# Patient Record
Sex: Female | Born: 1965 | Race: Black or African American | Hispanic: No | Marital: Married | Smoking: Never smoker
Health system: Southern US, Community
[De-identification: ages and names within clinical notes are randomized; demographics above are authoritative.]

## PROBLEM LIST (undated history)

## (undated) HISTORY — PX: BREAST MASS EXCISION: SHX1267

## (undated) HISTORY — PX: BREAST CYST EXCISION: SHX579

---

## 2011-05-25 DIAGNOSIS — D563 Thalassemia minor: Secondary | ICD-10-CM | POA: Insufficient documentation

## 2011-05-25 DIAGNOSIS — K219 Gastro-esophageal reflux disease without esophagitis: Secondary | ICD-10-CM | POA: Insufficient documentation

## 2013-02-15 DIAGNOSIS — M25569 Pain in unspecified knee: Secondary | ICD-10-CM | POA: Insufficient documentation

## 2013-03-18 DIAGNOSIS — M6282 Rhabdomyolysis: Secondary | ICD-10-CM | POA: Insufficient documentation

## 2013-08-14 HISTORY — PX: COLONOSCOPY W/ BIOPSIES: SHX1374

## 2013-10-22 ENCOUNTER — Encounter: Payer: Self-pay | Admitting: *Deleted

## 2013-10-22 NOTE — Telephone Encounter (Signed)
error 

## 2019-07-18 ENCOUNTER — Other Ambulatory Visit (HOSPITAL_COMMUNITY)
Admission: RE | Admit: 2019-07-18 | Discharge: 2019-07-18 | Disposition: A | Discharge status: Home / Self Care | Source: Ambulatory Visit | Attending: Family Medicine | Admitting: Family Medicine

## 2019-07-18 ENCOUNTER — Other Ambulatory Visit: Payer: Self-pay

## 2019-07-18 ENCOUNTER — Encounter: Payer: Self-pay | Admitting: Family Medicine

## 2019-07-18 ENCOUNTER — Ambulatory Visit (INDEPENDENT_AMBULATORY_CARE_PROVIDER_SITE_OTHER): Admitting: Family Medicine

## 2019-07-18 VITALS — BP 138/72 | HR 66 | Wt 141.0 lb

## 2019-07-18 DIAGNOSIS — Z114 Encounter for screening for human immunodeficiency virus [HIV]: Secondary | ICD-10-CM

## 2019-07-18 DIAGNOSIS — R030 Elevated blood-pressure reading, without diagnosis of hypertension: Secondary | ICD-10-CM

## 2019-07-18 DIAGNOSIS — Z124 Encounter for screening for malignant neoplasm of cervix: Secondary | ICD-10-CM

## 2019-07-18 DIAGNOSIS — G8929 Other chronic pain: Secondary | ICD-10-CM

## 2019-07-18 DIAGNOSIS — Z1231 Encounter for screening mammogram for malignant neoplasm of breast: Secondary | ICD-10-CM | POA: Diagnosis not present

## 2019-07-18 DIAGNOSIS — Z23 Encounter for immunization: Secondary | ICD-10-CM

## 2019-07-18 DIAGNOSIS — E789 Disorder of lipoprotein metabolism, unspecified: Secondary | ICD-10-CM

## 2019-07-18 DIAGNOSIS — Z8639 Personal history of other endocrine, nutritional and metabolic disease: Secondary | ICD-10-CM

## 2019-07-18 DIAGNOSIS — D563 Thalassemia minor: Secondary | ICD-10-CM

## 2019-07-18 DIAGNOSIS — M25561 Pain in right knee: Secondary | ICD-10-CM

## 2019-07-18 DIAGNOSIS — K219 Gastro-esophageal reflux disease without esophagitis: Secondary | ICD-10-CM

## 2019-07-18 LAB — POCT GLYCOSYLATED HEMOGLOBIN (HGB A1C): Hemoglobin A1C: 5.5 % (ref 4.0–5.6)

## 2019-07-18 NOTE — Patient Instructions (Addendum)
Dear Jasmine Rogers,   It was good to meet you! Thank you for taking your time to come in to be seen. Today, we discussed the following:   Annual Check up   We are checking several labs today, performing pap smear, and other health maintenance items such as ordering mammogram, flu and Tdap vaccines.   I will call you if there are any abnormalities for follow up.    Your blood pressure was elevated today and I would like to keep a close eye on that.  Please continue to take your blood pressure, even at work, and if they continue to be above 120/80, please write these measurements down and bring them into the office for follow-up appointment.  Orders Placed This Encounter  Procedures  . MM Digital Screening  . Tdap vaccine greater than or equal to 7yo IM  . Flu Vaccine QUAD 6+ mos PF IM (Fluarix Quad PF)  . Lipid Panel  . CBC with Differential  . TSH  . Comprehensive metabolic panel  . HIV antibody (with reflex)  . HgB A1c   Be well,   Zettie Cooley, M.D   [place] [phone]  *Sign up for MyChart for instant access to your health profile, labs, orders, upcoming appointments or to contact your provider with questions*  ===================================================================================  Hypertension, Adult High blood pressure (hypertension) is when the force of blood pumping through the arteries is too strong. The arteries are the blood vessels that carry blood from the heart throughout the body. Hypertension forces the heart to work harder to pump blood and may cause arteries to become narrow or stiff. Untreated or uncontrolled hypertension can cause a heart attack, heart failure, a stroke, kidney disease, and other problems. A blood pressure reading consists of a higher number over a lower number. Ideally, your blood pressure should be below 120/80. The first ("top") number is called the systolic pressure. It is a measure of the pressure in your  arteries as your heart beats. The second ("bottom") number is called the diastolic pressure. It is a measure of the pressure in your arteries as the heart relaxes. What are the causes? The exact cause of this condition is not known. There are some conditions that result in or are related to high blood pressure. What increases the risk? Some risk factors for high blood pressure are under your control. The following factors may make you more likely to develop this condition:  Smoking.  Having type 2 diabetes mellitus, high cholesterol, or both.  Not getting enough exercise or physical activity.  Being overweight.  Having too much fat, sugar, calories, or salt (sodium) in your diet.  Drinking too much alcohol. Some risk factors for high blood pressure may be difficult or impossible to change. Some of these factors include:  Having chronic kidney disease.  Having a family history of high blood pressure.  Age. Risk increases with age.  Race. You may be at higher risk if you are African American.  Gender. Men are at higher risk than women before age 22. After age 43, women are at higher risk than men.  Having obstructive sleep apnea.  Stress. What are the signs or symptoms? High blood pressure may not cause symptoms. Very high blood pressure (hypertensive crisis) may cause:  Headache.  Anxiety.  Shortness of breath.  Nosebleed.  Nausea and vomiting.  Vision changes.  Severe chest pain.  Seizures. How is this diagnosed? This condition is diagnosed by measuring your blood pressure  while you are seated, with your arm resting on a flat surface, your legs uncrossed, and your feet flat on the floor. The cuff of the blood pressure monitor will be placed directly against the skin of your upper arm at the level of your heart. It should be measured at least twice using the same arm. Certain conditions can cause a difference in blood pressure between your right and left  arms. Certain factors can cause blood pressure readings to be lower or higher than normal for a short period of time:  When your blood pressure is higher when you are in a health care provider's office than when you are at home, this is called white coat hypertension. Most people with this condition do not need medicines.  When your blood pressure is higher at home than when you are in a health care provider's office, this is called masked hypertension. Most people with this condition may need medicines to control blood pressure. If you have a high blood pressure reading during one visit or you have normal blood pressure with other risk factors, you may be asked to:  Return on a different day to have your blood pressure checked again.  Monitor your blood pressure at home for 1 week or longer. If you are diagnosed with hypertension, you may have other blood or imaging tests to help your health care provider understand your overall risk for other conditions. How is this treated? This condition is treated by making healthy lifestyle changes, such as eating healthy foods, exercising more, and reducing your alcohol intake. Your health care provider may prescribe medicine if lifestyle changes are not enough to get your blood pressure under control, and if:  Your systolic blood pressure is above 130.  Your diastolic blood pressure is above 80. Your personal target blood pressure may vary depending on your medical conditions, your age, and other factors. Follow these instructions at home: Eating and drinking   Eat a diet that is high in fiber and potassium, and low in sodium, added sugar, and fat. An example eating plan is called the DASH (Dietary Approaches to Stop Hypertension) diet. To eat this way: ? Eat plenty of fresh fruits and vegetables. Try to fill one half of your plate at each meal with fruits and vegetables. ? Eat whole grains, such as whole-wheat pasta, [place] rice, or whole-grain  bread. Fill about one fourth of your plate with whole grains. ? Eat or drink low-fat dairy products, such as skim milk or low-fat yogurt. ? Avoid fatty cuts of meat, processed or cured meats, and poultry with skin. Fill about one fourth of your plate with lean proteins, such as fish, chicken without skin, beans, eggs, or tofu. ? Avoid pre-made and processed foods. These tend to be higher in sodium, added sugar, and fat.  Reduce your daily sodium intake. Most people with hypertension should eat less than 1,500 mg of sodium a day.  Do not drink alcohol if: ? Your health care provider tells you not to drink. ? You are pregnant, may be pregnant, or are planning to become pregnant.  If you drink alcohol: ? Limit how much you use to:  0-1 drink a day for women.  0-2 drinks a day for men. ? Be aware of how much alcohol is in your drink. In the [place] one drink equals one 12 oz bottle of beer (355 mL), one 5 oz glass of wine (148 mL), or one 1 oz glass of hard liquor (44 mL).  Lifestyle   Work with your health care provider to maintain a healthy body weight or to lose weight. Ask what an ideal weight is for you.  Get at least 30 minutes of exercise most days of the week. Activities may include walking, swimming, or biking.  Include exercise to strengthen your muscles (resistance exercise), such as Pilates or lifting weights, as part of your weekly exercise routine. Try to do these types of exercises for 30 minutes at least 3 days a week.  Do not use any products that contain nicotine or tobacco, such as cigarettes, e-cigarettes, and chewing tobacco. If you need help quitting, ask your health care provider.  Monitor your blood pressure at home as told by your health care provider.  Keep all follow-up visits as told by your health care provider. This is important. Medicines  Take over-the-counter and prescription medicines only as told by your health care provider. Follow directions carefully.  Blood pressure medicines must be taken as prescribed.  Do not skip doses of blood pressure medicine. Doing this puts you at risk for problems and can make the medicine less effective.  Ask your health care provider about side effects or reactions to medicines that you should watch for. Contact a health care provider if you:  Think you are having a reaction to a medicine you are taking.  Have headaches that keep coming back (recurring).  Feel dizzy.  Have swelling in your ankles.  Have trouble with your vision. Get help right away if you:  Develop a severe headache or confusion.  Have unusual weakness or numbness.  Feel faint.  Have severe pain in your chest or abdomen.  Vomit repeatedly.  Have trouble breathing. Summary  Hypertension is when the force of blood pumping through your arteries is too strong. If this condition is not controlled, it may put you at risk for serious complications.  Your personal target blood pressure may vary depending on your medical conditions, your age, and other factors. For most people, a normal blood pressure is less than 120/80.  Hypertension is treated with lifestyle changes, medicines, or a combination of both. Lifestyle changes include losing weight, eating a healthy, low-sodium diet, exercising more, and limiting alcohol. This information is not intended to replace advice given to you by your health care provider. Make sure you discuss any questions you have with your health care provider. Document Released: 10/11/2005 Document Revised: 06/21/2018 Document Reviewed: 06/21/2018 Elsevier Patient Education  2020 Elsevier Inc.  Thalassemia  Thalassemia is a blood disorder that causes a low level of red blood cells (anemia). This condition is passed from parent to child through abnormal genes (gene mutations). The mutations make it hard for your body to make the protein in red blood cells (hemoglobin) that carries oxygen from your lungs to  the rest of your body. Red blood cells do not live long without hemoglobin. Loss of red blood cells leads to anemia, which is the main symptom of thalassemia. There are two main types of thalassemia. The type depends on which part of the hemoglobin is affected.  Alpha thalassemia affects the alpha part of the hemoglobin. This is caused by four genes. You could get two from each parent.  Beta thalassemia affects the beta part of the hemoglobin. This is caused by two genes. You could get one from each parent. Thalassemia can be mild or severe. It depends on how many gene mutations you are born with. The more gene mutations you get, the more severe  the condition. A person who inherits just one gene will be a carrier of the condition (thalassemia trait). A person with thalassemia trait may not have any symptoms or may have only mild anemia. A person who inherits two or more genes can have thalassemia minor, thalassemia intermedia, or thalassemia major. Thalassemia is a lifelong condition. There is no cure, but treatment can control symptoms and manage the condition. What are the causes? Thalassemia is cause by gene mutations that are passed down through families. What increases the risk? You are more likely to develop this condition if:  You have a family history of thalassemia.  Your ancestors are from [place], [place], [place], [place], [place], or the [place] What are the signs or symptoms? The most common signs and symptoms of thalassemia are the signs and symptoms of anemia. They include:  Weakness.  Tiredness.  Pounding heartbeat.  Dizziness.  Headache.  Leg cramps.  Pale skin.  Confusion.  Shortness of breath. Other signs and symptoms can also occur. You may have:  Yellow eyes or skin, and dark urine (jaundice). The breakdown of red blood cells can cause a yellowing pigment (bilirubin) to build up in your blood.  Weak bones (osteoporosis) and bone fractures. This is  because bones can weaken from the effort of making more hemoglobin.  An enlarged spleen. This can lead to a swollen belly. Your spleen can become enlarged from filtering dead red blood cells.  Frequent, severe infections. This occurs if your spleen and bone marrow become weak. These organs make white blood cells that your body needs to fight infections. How is this diagnosed? Your health care provider may suspect thalassemia based on your signs and symptoms, especially if you have a family history of the condition. This condition may be diagnosed:  In childhood, if you have severe forms of thalassemia. This is because symptoms show early in life.  At birth. In the [place] babies are screened for this condition.  In adulthood, if you have thalassemia trait or thalassemia minor. This happens if symptoms of anemia start or if a routine blood test shows unexplained anemia. Blood tests can confirm a thalassemia diagnosis. Blood tests may show:  Low hemoglobin.  Low iron.  Abnormal hemoglobin.  Thalassemia gene mutations. You may need to see a health care provider who specializes in blood diseases (hematologist). How is this treated? Treatment for this condition depends on the type of thalassemia that you have:  If you have thalassemia trait or thalassemia minor, you may not need treatment. However, you may need treatment if you have thalassemia minor and you develop symptoms during an infection.  If you have thalassemia intermedia, you will have symptoms that require treatment.  If you have major thalassemia, you will have serious symptoms that require regular treatment. Thalassemia treatment may include:  Donated blood (transfusions) to replace red blood cells.  Vitamin B (folic acid) supplements to help produce hemoglobin and red blood cells.  Medicines or injections to remove iron buildup (chelation). This can happen in people who have frequent transfusions. Iron overload can damage  heart, liver, and brain cells.  In the case of severe thalassemia: ? The spleen may need to be removed if it becomes damaged. ? Stem cell or bone marrow transplants may be necessary to transplant cells that can make red blood cells. This may be done if transfusions are not working. Follow these instructions at home: Eating and drinking   Follow instructions from your health care provider about eating or drinking  restrictions. You may need to avoid foods or drinks that are high in iron or fortified with iron.  Eat foods that are high in fiber, such as fresh fruits and vegetables, whole grains, and beans. Limit foods that are high in fat and processed sugars, such as fried and sweet foods. Nutrition is important for preventing anemia. Activity  Return to your normal activities as told by your health care provider. Ask your health care provider what activities are safe for you.  Exercise is important for maintaining energy and strong bones. Ask your health care provider what amount and type of exercise is safe for you. General instructions   Take over-the-counter and prescription medicines only as told by your health care provider.  Keep all routine vaccinations and flu shots up to date to reduce your risk of infection.  Wash your hands frequently.  Do your best to avoid sick people, and stay out of crowds during cold and flu seasons.  Meet with a Dentistgenetic counselor if you are or may become pregnant. A genetic counselor can explain the risks of passing thalassemia to a child.  Keep all follow-up visits as told by your health care provider. This is important. Contact a health care provider if:  You have signs or symptoms of anemia.  You have a fever or other signs of infection.  Your belly is swollen.  You have jaundice. Get help right away if:  You feel very weak or short of breath. Summary  Thalassemia is a blood disorder that causes anemia.  Thalassemia can range from  mild to severe.  This condition is passed down through families.  There is no cure, but treatment can manage the symptoms and prevent anemia. This information is not intended to replace advice given to you by your health care provider. Make sure you discuss any questions you have with your health care provider. Document Released: 02/07/2018 Document Revised: 02/07/2018 Document Reviewed: 02/07/2018 Elsevier Patient Education  2020 [employer]

## 2019-07-18 NOTE — Progress Notes (Signed)
New Patient Clinic Visit Subjective  Subjective:  Patient ID: [id number]  Date of birth: 05/17/1966  PCP: Melene Plan, MD  CC: Establish care    HPI Jasmine Rogers is a 53 y.o. female who presents today to establish care. She has no other complaints today. Patient reports that her last physical was on 12/19/2017 in [place].   Past medical history is significant for GERD, betal thalassemia trait, leukopenia, chronic knee pain, hyperglycemia.   HISTORY Reviewed with patient and updated in EMR as appropriate.  Allergies and Medications Allergies: latex and Strawberries  Medications: daily MVI   Past Medical History  GERD - previously took ranitidine, is not on anymore. Beta Thalassemia trait - No complications Leukocytopenia for several years  - no complications.  Chronic Knee pain -- Flares up with overuse. Previous X ray with little fluid, no OA changes,then PT. If she works 2-3 days straight for 12 hours, tends to flare up.  Elevated A1C & Cholesterol - not currently on a statin  Heart Mumur: Received echocardiogram in 05/03/2013 and treademill stress test on 03/29/2013 and returned normal  Previous Physician: Maryruth Eve, Robbie Lis, MD [facility] primary [facility]   Health Maintenance: Mammograms: last in 12/19/2017 - see care everywhere - annual with "digital screen" Colonoscopy: 07/28/2016 - repeat in 5 years  Pap Smear: patient prefers annual pap smears, though we discussed the new guidelines. Hx of LEEP in her 73's. No issues since then. Hep A, B, C screening negative  Menstrual History: Post-menopausal  OB History: [id number]  Family History family history includes Alcohol abuse in her brother; Alzheimer's disease in her mother; Hyperlipidemia in her father.   Social History  Makynzee reports that she has never smoked. She has never used smokeless tobacco. She reports current alcohol use of about 7.0 standard drinks of alcohol per week. She reports that she does not use drugs.  Lived in [place] for 15-16 years with husband who is now retired and she moved back to [place] where she was born and raised.  3 kids, 34 & 32 & 32 all girls.  Husband is age 51, no pets at home.  Education: finished some college  Review of Systems  Constitutional: Negative.   HENT: Negative.   Eyes: Negative.   Respiratory: Negative.   Cardiovascular: Negative.   Gastrointestinal: Negative.   Genitourinary: Negative.   Musculoskeletal: Negative.   Skin: Negative.   Neurological: Negative.   Endo/Heme/Allergies: Negative.   Psychiatric/Behavioral: Negative.      Objective  Objective:  Physical Exam:  BP 138/72   Pulse 66   Wt 141 lb (64 kg)   SpO2 100%   Gen: NAD, alert, non-toxic, 5'9" African American female, thin, fit.  HEENT: Normocephaic, atraumatic. PERRLA, clear conjuctiva, no scleral icterus and injection. Normal EOM. Hearing intact. TM pearly grey bilaterally with no fluid. Neck supple with no LAD, nodules, or gross abnormality. Nares patent with no discharge.  Maxillary and frontal sinuses nontender to palpation. Oropharynx without erythema and lesions.  Tonsils nonswollen and without exudate.   CV: Regular rate and rhythm.  Normal S1-S2. 1/6 systolic ejection murmur. No gallops, S3, S4 appreciated.  Normal capillary refill bilaterally.  Radial pulses 2+ bilaterally. No bilateral lower extremity edema. Resp: Clear to auscultation bilaterally.  No wheezing, rales, rhonchi, or other abnormal lung sounds.  No increased work of breathing appreciated. Abd: Nontender and nondistended on palpation to all 4 quadrants.  Positive bowel sounds. Skin: No obvious rashes, lesions, or trauma.  Normal turgor.  MSK: Normal ROM. Normal strength and tone.  Neuro: Cranial nerves II through VI grossly intact. Gait normal.  Alert and oriented x4.  No obvious abnormal movements. Psych: Cooperative with exam.  Normal speech. Pleasant. Makes good eye contact. GU: External vulva and vagina  nonerythematous, without any obvious lesions or rash.  Normal ruggae of vaginal walls.  Cervix is non erythematous and non-friable.  There is no cervical motion tenderness, masses or gross abnormalities appreciated during bimanual exam.    Pertinent Labs & Imaging:  Reviewed on Care Everywhere   12/19/2017 - RPR, GC/C,Syph negative  11/07/2017 - WBC 3.82 03/27/2013 - Vit D 49    Assessment  Assessment & Plan:  Knee pain No pain or abnormal phys exam findings  History of hyperglycemia Will obtain A1C and BMP  Esophageal reflux Not currently taking anything. Asymptomatic today  Abnormal serum cholesterol Has tried to manage cholesterol with diet and exercise. Has never been on statin.    Elevated blood pressure reading 138/72 today. Patient encouraged to continue taking BP at work when she is calm as she does not have a BP cuff at home. If values > 140/90, she should return for closer follow up   Health Maintenance: Mammogram and pap smear obtained. Flu and Tdap administered.   Follow up: No future appointments. - pending lab results   Wilber Oliphant, M.D.  PGY-2  Family Medicine  [phone] 07/19/2019 2:24 PM

## 2019-07-19 ENCOUNTER — Encounter: Payer: Self-pay | Admitting: Family Medicine

## 2019-07-19 DIAGNOSIS — E789 Disorder of lipoprotein metabolism, unspecified: Secondary | ICD-10-CM | POA: Insufficient documentation

## 2019-07-19 DIAGNOSIS — Z114 Encounter for screening for human immunodeficiency virus [HIV]: Secondary | ICD-10-CM | POA: Insufficient documentation

## 2019-07-19 DIAGNOSIS — R030 Elevated blood-pressure reading, without diagnosis of hypertension: Secondary | ICD-10-CM | POA: Insufficient documentation

## 2019-07-19 DIAGNOSIS — Z124 Encounter for screening for malignant neoplasm of cervix: Secondary | ICD-10-CM | POA: Insufficient documentation

## 2019-07-19 DIAGNOSIS — Z1231 Encounter for screening mammogram for malignant neoplasm of breast: Secondary | ICD-10-CM | POA: Insufficient documentation

## 2019-07-19 DIAGNOSIS — Z8639 Personal history of other endocrine, nutritional and metabolic disease: Secondary | ICD-10-CM | POA: Insufficient documentation

## 2019-07-19 LAB — COMPREHENSIVE METABOLIC PANEL
ALT: 20 IU/L (ref 0–32)
AST: 21 IU/L (ref 0–40)
Albumin/Globulin Ratio: 2.1 (ref 1.2–2.2)
Albumin: 4.8 g/dL (ref 3.8–4.9)
Alkaline Phosphatase: 62 IU/L (ref 39–117)
BUN/Creatinine Ratio: 17 (ref 9–23)
BUN: 12 mg/dL (ref 6–24)
Bilirubin Total: 0.4 mg/dL (ref 0.0–1.2)
CO2: 26 mmol/L (ref 20–29)
Calcium: 9.8 mg/dL (ref 8.7–10.2)
Chloride: 103 mmol/L (ref 96–106)
Creatinine, Ser: 0.71 mg/dL (ref 0.57–1.00)
GFR calc Af Amer: 112 mL/min/{1.73_m2} (ref >59)
GFR calc non Af Amer: 98 mL/min/{1.73_m2} (ref >59)
Globulin, Total: 2.3 g/dL (ref 1.5–4.5)
Glucose: 88 mg/dL (ref 65–99)
Potassium: 3.9 mmol/L (ref 3.5–5.2)
Sodium: 142 mmol/L (ref 134–144)
Total Protein: 7.1 g/dL (ref 6.0–8.5)

## 2019-07-19 LAB — CBC WITH DIFFERENTIAL/PLATELET
Basophils Absolute: 0 10*3/uL (ref 0.0–0.2)
Basos: 1 %
EOS (ABSOLUTE): 0 10*3/uL (ref 0.0–0.4)
Eos: 1 %
Hematocrit: 37.8 % (ref 34.0–46.6)
Hemoglobin: 12.3 g/dL (ref 11.1–15.9)
Immature Grans (Abs): 0 10*3/uL (ref 0.0–0.1)
Immature Granulocytes: 0 %
Lymphocytes Absolute: 1.3 10*3/uL (ref 0.7–3.1)
Lymphs: 38 %
MCH: 24.6 pg — ABNORMAL LOW (ref 26.6–33.0)
MCHC: 32.5 g/dL (ref 31.5–35.7)
MCV: 75 fL — ABNORMAL LOW (ref 79–97)
Monocytes Absolute: 0.3 10*3/uL (ref 0.1–0.9)
Monocytes: 9 %
Neutrophils Absolute: 1.8 10*3/uL (ref 1.4–7.0)
Neutrophils: 51 %
Platelets: 208 10*3/uL (ref 150–450)
RBC: 5.01 x10E6/uL (ref 3.77–5.28)
RDW: 14.3 % (ref 11.7–15.4)
WBC: 3.5 10*3/uL (ref 3.4–10.8)

## 2019-07-19 LAB — LIPID PANEL
Chol/HDL Ratio: 2.1 ratio (ref 0.0–4.4)
Cholesterol, Total: 230 mg/dL — ABNORMAL HIGH (ref 100–199)
HDL: 109 mg/dL (ref >39)
LDL Chol Calc (NIH): 113 mg/dL — ABNORMAL HIGH (ref 0–99)
Triglycerides: 44 mg/dL (ref 0–149)
VLDL Cholesterol Cal: 8 mg/dL (ref 5–40)

## 2019-07-19 LAB — HIV ANTIBODY (ROUTINE TESTING W REFLEX): HIV Screen 4th Generation wRfx: NONREACTIVE

## 2019-07-19 LAB — TSH: TSH: 0.948 u[IU]/mL (ref 0.450–4.500)

## 2019-07-19 NOTE — Assessment & Plan Note (Addendum)
Has tried to manage cholesterol with diet and exercise. Has never been on statin.

## 2019-07-19 NOTE — Assessment & Plan Note (Signed)
No pain or abnormal phys exam findings

## 2019-07-19 NOTE — Assessment & Plan Note (Signed)
138/72 today. Patient encouraged to continue taking BP at work when she is calm as she does not have a BP cuff at home. If values > 140/90, she should return for closer follow up

## 2019-07-19 NOTE — Assessment & Plan Note (Signed)
Not currently taking anything. Asymptomatic today

## 2019-07-19 NOTE — Assessment & Plan Note (Signed)
Will obtain A1C and BMP

## 2019-07-20 LAB — CYTOLOGY - PAP
Chlamydia: NEGATIVE
Diagnosis: NEGATIVE
High risk HPV: NEGATIVE
Molecular Disclaimer: 56
Molecular Disclaimer: NEGATIVE
Molecular Disclaimer: NORMAL
Molecular Disclaimer: NORMAL
Neisseria Gonorrhea: NEGATIVE

## 2019-07-23 ENCOUNTER — Encounter: Payer: Self-pay | Admitting: Family Medicine

## 2019-07-31 ENCOUNTER — Telehealth: Payer: Self-pay | Admitting: *Deleted

## 2019-07-31 NOTE — Telephone Encounter (Signed)
Pt calls back about results.  She states that she has not been exercising and has been eating a lot of fried food.  For now she wants to try diet and exercise and will have cholesterol rechecked in one year.  Christen Bame, CMA

## 2019-08-02 NOTE — Telephone Encounter (Signed)
Please ask patient to return in 3-6 months for a lab visit for lipid panel. One year would be a long time to wait to recheck after lifestyle changes.

## 2019-08-08 NOTE — Telephone Encounter (Signed)
Pt informed of below.Jasmine Rogers, CMA ? ?

## 2019-09-12 ENCOUNTER — Ambulatory Visit
Admission: RE | Admit: 2019-09-12 | Discharge: 2019-09-12 | Disposition: A | Discharge status: Home / Self Care | Source: Ambulatory Visit | Attending: Family Medicine | Admitting: Family Medicine

## 2019-09-12 ENCOUNTER — Other Ambulatory Visit: Payer: Self-pay

## 2019-09-12 DIAGNOSIS — Z1231 Encounter for screening mammogram for malignant neoplasm of breast: Secondary | ICD-10-CM

## 2019-10-31 ENCOUNTER — Other Ambulatory Visit: Payer: Self-pay

## 2019-10-31 ENCOUNTER — Telehealth (INDEPENDENT_AMBULATORY_CARE_PROVIDER_SITE_OTHER): Admitting: Family Medicine

## 2019-10-31 DIAGNOSIS — F419 Anxiety disorder, unspecified: Secondary | ICD-10-CM | POA: Diagnosis not present

## 2019-10-31 DIAGNOSIS — F329 Major depressive disorder, single episode, unspecified: Secondary | ICD-10-CM | POA: Diagnosis not present

## 2019-10-31 MED ORDER — FLUOXETINE HCL 20 MG PO TABS: 20.0000 mg | ORAL_TABLET | Freq: Every day | ORAL | 0 refills | Status: DC

## 2019-10-31 NOTE — Assessment & Plan Note (Signed)
Patient with approximately 40-month history of worsening depression.  Patient is also had issues with anxiety attacks which involve chest discomfort.  She has been evaluated for these and reports a negative cardiac work-up.  Patient is also had issues with sleep in this timeframe.  Patient is attempting good sleep hygiene although she does occasionally have a glass of wine or beer to help her get to sleep.  Patient has history of depression which she was treated for with Prozac approximately 30 years ago but reports good outcome. -Prescribed 20 mg Prozac daily -Counseled on the use of SSRIs and possible side effects -Follow-up in 1 month to see if depression is decreasing -Counseled on proper sleep hygiene -Recommended starting melatonin nightly 30 minutes prior to going to sleep

## 2019-10-31 NOTE — Patient Instructions (Addendum)
Melatonin  30 minutes before bedtime   Fluoxetine capsules or tablets (Depression/Mood Disorders) What is this medicine? FLUOXETINE (floo OX e teen) belongs to a class of drugs known as selective serotonin reuptake inhibitors (SSRIs). It helps to treat mood problems such as depression, obsessive compulsive disorder, and panic attacks. It can also treat certain eating disorders. This medicine may be used for other purposes; ask your health care provider or pharmacist if you have questions. COMMON BRAND NAME(S): Prozac What should I tell my health care provider before I take this medicine? They need to know if you have any of these conditions:  bipolar disorder or a family history of bipolar disorder  bleeding disorders  glaucoma  heart disease  liver disease  low levels of sodium in the blood  seizures  suicidal thoughts, plans, or attempt; a previous suicide attempt by you or a family member  take MAOIs like Carbex, Eldepryl, Marplan, Nardil, and Parnate  take medicines that treat or prevent blood clots  thyroid disease  an unusual or allergic reaction to fluoxetine, other medicines, foods, dyes, or preservatives  pregnant or trying to get pregnant  breast-feeding How should I use this medicine? Take this medicine by mouth with a glass of water. Follow the directions on the prescription label. You can take this medicine with or without food. Take your medicine at regular intervals. Do not take it more often than directed. Do not stop taking this medicine suddenly except upon the advice of your doctor. Stopping this medicine too quickly may cause serious side effects or your condition may worsen. A special MedGuide will be given to you by the pharmacist with each prescription and refill. Be sure to read this information carefully each time. Talk to your pediatrician regarding the use of this medicine in children. While this drug may be prescribed for children as young as 7  years for selected conditions, precautions do apply. Overdosage: If you think you have taken too much of this medicine contact a poison control center or emergency room at once. NOTE: This medicine is only for you. Do not share this medicine with others. What if I miss a dose? If you miss a dose, skip the missed dose and go back to your regular dosing schedule. Do not take double or extra doses. What may interact with this medicine? Do not take this medicine with any of the following medications:  other medicines containing fluoxetine, like Sarafem or Symbyax  cisapride  dronedarone  linezolid  MAOIs like Carbex, Eldepryl, Marplan, Nardil, and Parnate  methylene blue (injected into a vein)  pimozide  thioridazine This medicine may also interact with the following medications:  alcohol  amphetamines  aspirin and aspirin-like medicines  carbamazepine  certain medicines for depression, anxiety, or psychotic disturbances  certain medicines for migraine headaches like almotriptan, eletriptan, frovatriptan, naratriptan, rizatriptan, sumatriptan, zolmitriptan  digoxin  diuretics  fentanyl  flecainide  furazolidone  isoniazid  lithium  medicines for sleep  medicines that treat or prevent blood clots like warfarin, enoxaparin, and dalteparin  NSAIDs, medicines for pain and inflammation, like ibuprofen or naproxen  other medicines that prolong the QT interval (an abnormal heart rhythm)  phenytoin  procarbazine  propafenone  rasagiline  ritonavir  supplements like St. Demaria Deeney's wort, kava kava, valerian  tramadol  tryptophan  vinblastine This list may not describe all possible interactions. Give your health care provider a list of all the medicines, herbs, non-prescription drugs, or dietary supplements you use. Also tell them if  you smoke, drink alcohol, or use illegal drugs. Some items may interact with your medicine. What should I watch for while using  this medicine? Tell your doctor if your symptoms do not get better or if they get worse. Visit your doctor or health care professional for regular checks on your progress. Because it may take several weeks to see the full effects of this medicine, it is important to continue your treatment as prescribed by your doctor. Patients and their families should watch out for new or worsening thoughts of suicide or depression. Also watch out for sudden changes in feelings such as feeling anxious, agitated, panicky, irritable, hostile, aggressive, impulsive, severely restless, overly excited and hyperactive, or not being able to sleep. If this happens, especially at the beginning of treatment or after a change in dose, call your health care professional. Dennis Bast may get drowsy or dizzy. Do not drive, use machinery, or do anything that needs mental alertness until you know how this medicine affects you. Do not stand or sit up quickly, especially if you are an older patient. This reduces the risk of dizzy or fainting spells. Alcohol may interfere with the effect of this medicine. Avoid alcoholic drinks. Your mouth may get dry. Chewing sugarless gum or sucking hard candy, and drinking plenty of water may help. Contact your doctor if the problem does not go away or is severe. This medicine may affect blood sugar levels. If you have diabetes, check with your doctor or health care professional before you change your diet or the dose of your diabetic medicine. What side effects may I notice from receiving this medicine? Side effects that you should report to your doctor or health care professional as soon as possible:  allergic reactions like skin rash, itching or hives, swelling of the face, lips, or tongue  anxious  black, tarry stools  breathing problems  changes in vision  confusion  elevated mood, decreased need for sleep, racing thoughts, impulsive behavior  eye pain  fast, irregular heartbeat  feeling  faint or lightheaded, falls  feeling agitated, angry, or irritable  hallucination, loss of contact with reality  loss of balance or coordination  loss of memory  painful or prolonged erections  restlessness, pacing, inability to keep still  seizures  stiff muscles  suicidal thoughts or other mood changes  trouble sleeping  unusual bleeding or bruising  unusually weak or tired  vomiting Side effects that usually do not require medical attention (report to your doctor or health care professional if they continue or are bothersome):  change in appetite or weight  change in sex drive or performance  diarrhea  dry mouth  headache  increased sweating  nausea  tremors This list may not describe all possible side effects. Call your doctor for medical advice about side effects. You may report side effects to FDA at 1-800-FDA-1088. Where should I keep my medicine? Keep out of the reach of children. Store at room temperature between 15 and 30 degrees C (59 and 86 degrees F). Throw away any unused medicine after the expiration date. NOTE: This sheet is a summary. It may not cover all possible information. If you have questions about this medicine, talk to your doctor, pharmacist, or health care provider.  2020 Elsevier/Gold Standard (2018-06-01 11:56:53)

## 2019-10-31 NOTE — Progress Notes (Signed)
[facility] Telemedicine Visit  Patient consented to have virtual visit. Method of visit: Video  Encounter participants: Patient: Jasmine Rogers - located at home  Provider: Derrel Nip - located at home   Chief Complaint: Depression issues and anxiety   HPI:   Depression Patient reports that she and her husband moved from [place] to begin the year to be closer to the rest of her family.  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  Since March she has noticed worsening issues with depression.  She feels like because of Covid she cannot do the things she used to enjoy.  She still likes to walk around her neighborhood as well as ride bikes.  She does this regularly.  She has also noticed issues with sleep.  She sleeps 2 to 3 hours a night.  She will sometimes have 1 glass of wine or a beer to help her get to sleep but then she wakes up around 1 or 2 in the morning and is unable to get back to sleep.  She avoids caffeine before bed.  She has tried no other methods for increased sleep.  Patient also reports increased anxiety and that she has been having issues with what she thinks her anxiety attacks over the past few months.  She reports chest tightness which she feels like his chest anxiety in her chest.  She was seen in the [facility] emergency room in November for 1 of these episodes and reports that she had a cardiac work-up including EKG and lab work which came back normal.  She describes the feeling as discomfort, no pain, no shortness of breath or radiation anywhere.  She had an episode of this last night when she was feeling anxious but it is since resolved.  Patient reports issues with depression in the past when she went through a major life change.  This was approximately 30 years ago and she was given Prozac which she reports she had a good outcome with.  After a long discussion regarding her options patient wishes to try another trial of  Prozac.  ROS: per HPI  Pertinent PMHx: Depression, reflux  Exam: General: Patient appears well in no acute distress at the beginning of the exam.  She does have intermittent episodes of tearfulness throughout the discussion Respiratory: Speaking in clear sentences, no shortness of breath noted Psych: Patient denies any SI or HI  Assessment/Plan:  Anxiety and depression Patient with approximately 38-month history of worsening depression.  Patient is also had issues with anxiety attacks which involve chest discomfort.  She has been evaluated for these and reports a negative cardiac work-up.  Patient is also had issues with sleep in this timeframe.  Patient is attempting good sleep hygiene although she does occasionally have a glass of wine or beer to help her get to sleep.  Patient has history of depression which she was treated for with Prozac approximately 30 years ago but reports good outcome. -Prescribed 20 mg Prozac daily -Counseled on the use of SSRIs and possible side effects -Follow-up in 1 month to see if depression is decreasing -Counseled on proper sleep hygiene -Recommended starting melatonin nightly 30 minutes prior to going to sleep

## 2019-12-11 ENCOUNTER — Encounter (HOSPITAL_COMMUNITY): Payer: Self-pay | Admitting: Emergency Medicine

## 2019-12-11 ENCOUNTER — Emergency Department (HOSPITAL_COMMUNITY)
Admission: EM | Admit: 2019-12-11 | Discharge: 2019-12-12 | Disposition: A | Discharge status: Home / Self Care | Attending: Emergency Medicine | Admitting: Emergency Medicine

## 2019-12-11 ENCOUNTER — Emergency Department (HOSPITAL_COMMUNITY)

## 2019-12-11 DIAGNOSIS — F419 Anxiety disorder, unspecified: Secondary | ICD-10-CM | POA: Diagnosis not present

## 2019-12-11 DIAGNOSIS — R002 Palpitations: Secondary | ICD-10-CM | POA: Diagnosis not present

## 2019-12-11 DIAGNOSIS — E860 Dehydration: Secondary | ICD-10-CM | POA: Insufficient documentation

## 2019-12-11 DIAGNOSIS — R079 Chest pain, unspecified: Secondary | ICD-10-CM | POA: Diagnosis present

## 2019-12-11 LAB — CBC
HCT: 40.5 % (ref 36.0–46.0)
Hemoglobin: 12.4 g/dL (ref 12.0–15.0)
MCH: 23.7 pg — ABNORMAL LOW (ref 26.0–34.0)
MCHC: 30.6 g/dL (ref 30.0–36.0)
MCV: 77.3 fL — ABNORMAL LOW (ref 80.0–100.0)
Platelets: 223 10*3/uL (ref 150–400)
RBC: 5.24 MIL/uL — ABNORMAL HIGH (ref 3.87–5.11)
RDW: 14.8 % (ref 11.5–15.5)
WBC: 6.1 10*3/uL (ref 4.0–10.5)
nRBC: 0 % (ref 0.0–0.2)

## 2019-12-11 LAB — BASIC METABOLIC PANEL
Anion gap: 12 (ref 5–15)
BUN: 11 mg/dL (ref 6–20)
CO2: 26 mmol/L (ref 22–32)
Calcium: 9.2 mg/dL (ref 8.9–10.3)
Chloride: 104 mmol/L (ref 98–111)
Creatinine, Ser: 1.02 mg/dL — ABNORMAL HIGH (ref 0.44–1.00)
GFR calc Af Amer: >60 mL/min (ref >60)
GFR calc non Af Amer: >60 mL/min (ref >60)
Glucose, Bld: 148 mg/dL — ABNORMAL HIGH (ref 70–99)
Potassium: 3.8 mmol/L (ref 3.5–5.1)
Sodium: 142 mmol/L (ref 135–145)

## 2019-12-11 LAB — TROPONIN I (HIGH SENSITIVITY)
Troponin I (High Sensitivity): <2 ng/L (ref <18)
Troponin I (High Sensitivity): <2 ng/L (ref <18)

## 2019-12-11 LAB — I-STAT BETA HCG BLOOD, ED (MC, WL, AP ONLY): I-stat hCG, quantitative: <5 m[IU]/mL (ref <5)

## 2019-12-11 MED ORDER — SODIUM CHLORIDE 0.9% FLUSH
3.0000 mL | Freq: Once | INTRAVENOUS | Status: AC
  Administered 2019-12-12: 3 mL via INTRAVENOUS

## 2019-12-11 NOTE — ED Triage Notes (Signed)
Pt in POV. Reports chest palpitations/discomfort X2 days. Reports being under stress, has not slept much over past few days. Scheduled to see PCP tomorrow but felt she couldn't wait. Pt in NAD.

## 2019-12-12 ENCOUNTER — Ambulatory Visit (INDEPENDENT_AMBULATORY_CARE_PROVIDER_SITE_OTHER): Admitting: Family Medicine

## 2019-12-12 ENCOUNTER — Encounter: Payer: Self-pay | Admitting: Family Medicine

## 2019-12-12 ENCOUNTER — Other Ambulatory Visit: Payer: Self-pay

## 2019-12-12 VITALS — BP 142/80 | HR 79 | Wt 137.8 lb

## 2019-12-12 DIAGNOSIS — F419 Anxiety disorder, unspecified: Secondary | ICD-10-CM

## 2019-12-12 DIAGNOSIS — F329 Major depressive disorder, single episode, unspecified: Secondary | ICD-10-CM | POA: Diagnosis not present

## 2019-12-12 LAB — TSH: TSH: 1.986 u[IU]/mL (ref 0.350–4.500)

## 2019-12-12 LAB — D-DIMER, QUANTITATIVE: D-Dimer, Quant: <0.27 ug/mL-FEU (ref 0.00–0.50)

## 2019-12-12 MED ORDER — ACETAMINOPHEN 325 MG PO TABS
650.0000 mg | ORAL_TABLET | Freq: Once | ORAL | Status: AC
  Administered 2019-12-12: 650 mg via ORAL
  Filled 2019-12-12: qty 2

## 2019-12-12 MED ORDER — SODIUM CHLORIDE 0.9 % IV BOLUS
1000.0000 mL | Freq: Once | INTRAVENOUS | Status: AC
  Administered 2019-12-12: 1000 mL via INTRAVENOUS

## 2019-12-12 MED ORDER — LORAZEPAM 1 MG PO TABS
1.0000 mg | ORAL_TABLET | Freq: Once | ORAL | Status: AC
  Administered 2019-12-12: 1 mg via ORAL
  Filled 2019-12-12: qty 1

## 2019-12-12 MED ORDER — FLUOXETINE HCL 20 MG PO TABS: 20.0000 mg | ORAL_TABLET | Freq: Every day | ORAL | 0 refills | Status: DC

## 2019-12-12 MED ORDER — LORAZEPAM 1 MG PO TABS: 1.0000 mg | ORAL_TABLET | Freq: Three times a day (TID) | ORAL | 0 refills | Status: DC | PRN

## 2019-12-12 NOTE — Patient Instructions (Signed)
It was wonderful to see you today!  I am sorry you are having these issues with the heart palpitations and difficulty sleeping.  I am hoping that if we can get you started on this medication that will help with your stress levels and help you sleep.  Please try to decrease the amount of caffeine you are drinking. I would also like you to work on finding either stress relieving exercises such as possibly continuing her yoga.  I would like to see you in approximately 1 month just to see how you are doing.  If you have any questions or concerns before that please feel free to reach out to our clinic and we can work nightly.  I hope you have a wonderful rest of the day!   Generalized Anxiety Disorder, Adult Generalized anxiety disorder (GAD) is a mental health disorder. People with this condition constantly worry about everyday events. Unlike normal anxiety, worry related to GAD is not triggered by a specific event. These worries also do not fade or get better with time. GAD interferes with life functions, including relationships, work, and school. GAD can vary from mild to severe. People with severe GAD can have intense waves of anxiety with physical symptoms (panic attacks). What are the causes? The exact cause of GAD is not known. What increases the risk? This condition is more likely to develop in:  Women.  People who have a family history of anxiety disorders.  People who are very shy.  People who experience very stressful life events, such as the death of a loved one.  People who have a very stressful family environment. What are the signs or symptoms? People with GAD often worry excessively about many things in their lives, such as their health and family. They may also be overly concerned about:  Doing well at work.  Being on time.  Natural disasters.  Friendships. Physical symptoms of GAD include:  Fatigue.  Muscle tension or having muscle twitches.  Trembling or feeling  shaky.  Being easily startled.  Feeling like your heart is pounding or racing.  Feeling out of breath or like you cannot take a deep breath.  Having trouble falling asleep or staying asleep.  Sweating.  Nausea, diarrhea, or irritable bowel syndrome (IBS).  Headaches.  Trouble concentrating or remembering facts.  Restlessness.  Irritability. How is this diagnosed? Your health care provider can diagnose GAD based on your symptoms and medical history. You will also have a physical exam. The health care provider will ask specific questions about your symptoms, including how severe they are, when they started, and if they come and go. Your health care provider may ask you about your use of alcohol or drugs, including prescription medicines. Your health care provider may refer you to a mental health specialist for further evaluation. Your health care provider will do a thorough examination and may perform additional tests to rule out other possible causes of your symptoms. To be diagnosed with GAD, a person must have anxiety that:  Is out of his or her control.  Affects several different aspects of his or her life, such as work and relationships.  Causes distress that makes him or her unable to take part in normal activities.  Includes at least three physical symptoms of GAD, such as restlessness, fatigue, trouble concentrating, irritability, muscle tension, or sleep problems. Before your health care provider can confirm a diagnosis of GAD, these symptoms must be present more days than they are not, and they  must last for six months or longer. How is this treated? The following therapies are usually used to treat GAD:  Medicine. Antidepressant medicine is usually prescribed for long-term daily control. Antianxiety medicines may be added in severe cases, especially when panic attacks occur.  Talk therapy (psychotherapy). Certain types of talk therapy can be helpful in treating GAD by  providing support, education, and guidance. Options include: ? Cognitive behavioral therapy (CBT). People learn coping skills and techniques to ease their anxiety. They learn to identify unrealistic or negative thoughts and behaviors and to replace them with positive ones. ? Acceptance and commitment therapy (ACT). This treatment teaches people how to be mindful as a way to cope with unwanted thoughts and feelings. ? Biofeedback. This process trains you to manage your body's response (physiological response) through breathing techniques and relaxation methods. You will work with a therapist while machines are used to monitor your physical symptoms.  Stress management techniques. These include yoga, meditation, and exercise. A mental health specialist can help determine which treatment is best for you. Some people see improvement with one type of therapy. However, other people require a combination of therapies. Follow these instructions at home:  Take over-the-counter and prescription medicines only as told by your health care provider.  Try to maintain a normal routine.  Try to anticipate stressful situations and allow extra time to manage them.  Practice any stress management or self-calming techniques as taught by your health care provider.  Do not punish yourself for setbacks or for not making progress.  Try to recognize your accomplishments, even if they are small.  Keep all follow-up visits as told by your health care provider. This is important. Contact a health care provider if:  Your symptoms do not get better.  Your symptoms get worse.  You have signs of depression, such as: ? A persistently sad, cranky, or irritable mood. ? Loss of enjoyment in activities that used to bring you joy. ? Change in weight or eating. ? Changes in sleeping habits. ? Avoiding friends or family members. ? Loss of energy for normal tasks. ? Feelings of guilt or worthlessness. Get help right away  if:  You have serious thoughts about hurting yourself or others. If you ever feel like you may hurt yourself or others, or have thoughts about taking your own life, get help right away. You can go to your nearest emergency department or call:  Your local emergency services (911 in the [place]  A suicide crisis helpline, such as the National Suicide Prevention Lifeline at 661-311-0734. This is open 24 hours a day. Summary  Generalized anxiety disorder (GAD) is a mental health disorder that involves worry that is not triggered by a specific event.  People with GAD often worry excessively about many things in their lives, such as their health and family.  GAD may cause physical symptoms such as restlessness, trouble concentrating, sleep problems, frequent sweating, nausea, diarrhea, headaches, and trembling or muscle twitching.  A mental health specialist can help determine which treatment is best for you. Some people see improvement with one type of therapy. However, other people require a combination of therapies. This information is not intended to replace advice given to you by your health care provider. Make sure you discuss any questions you have with your health care provider. Document Revised: 09/23/2017 Document Reviewed: 08/31/2016 Elsevier Patient Education  2020 [employer].

## 2019-12-12 NOTE — ED Provider Notes (Signed)
[facility] EMERGENCY DEPARTMENT Provider Note   CSN: [id number] Arrival date & time: 12/11/19  1832     History Chief Complaint  Patient presents with  . Chest Pain    Jasmine Rogers is a 54 y.o. female.  HPI     This a 54 year old female with a history of hyperlipidemia, reflux, anxiety who presents with palpitations.  Patient reports onset of symptoms on Friday of this past week.  She reports she has been under a lot of stress and has not slept much in the last 4 days.  She has increased her caffeine intake and has been drinking tea.  She states she has had intermittent and worsening episodes of palpitations and lightheadedness.  She denies any chest pain or shortness of breath.  She denies any lower extremity swelling or history of blood clots.  Patient states that she was at work yesterday when she began to have palpitations and got lightheaded.  She left work early.  She has a primary care appointment in the morning.  She had to get her thyroid checked.  She has no history of thyroid disorder.  Currently she states she feels somewhat better.  Denies any recent illnesses.  Reports good p.o. intake.  History reviewed. No pertinent past medical history.  Patient Active Problem List   Diagnosis Date Noted  . Anxiety and depression 10/31/2019  . History of hyperglycemia 07/19/2019  . Abnormal serum cholesterol 07/19/2019  . Screening mammogram, encounter for 07/19/2019  . Elevated blood pressure reading 07/19/2019  . Encounter for screening for HIV 07/19/2019  . Pap smear for cervical cancer screening 07/19/2019  . Knee pain 02/15/2013  . Beta thalassemia trait 05/25/2011  . Esophageal reflux 05/25/2011    Past Surgical History:  Procedure Laterality Date  . BREAST CYST EXCISION Right   . BREAST MASS EXCISION Bilateral    Benign tumors. Total of 2 surgeries with 3 mass removals.  . COLONOSCOPY W/ BIOPSIES  08/14/2013   removal of polyp.      OB  History    Gravida  3   Para  3   Term  1   Preterm  2   AB      Living  3     SAB      TAB      Ectopic      Multiple      Live Births              Family History  Problem Relation Age of Onset  . Alzheimer's disease Mother   . Hyperlipidemia Father   . Alcohol abuse Brother        ETOH of drug abuse, not specifed     Social History   Tobacco Use  . Smoking status: Never Smoker  . Smokeless tobacco: Never Used  Substance Use Topics  . Alcohol use: Yes    Alcohol/week: 7.0 standard drinks    Types: 7 Glasses of wine per week    Comment: 1 glass of wine daily   . Drug use: Never    Home Medications Prior to Admission medications   Medication Sig Start Date End Date Taking? Authorizing Provider  FLUoxetine (PROZAC) 20 MG tablet Take 1 tablet (20 mg total) by mouth daily. 10/31/19   Derrel Nip, MD  LORazepam (ATIVAN) 1 MG tablet Take 1 tablet (1 mg total) by mouth every 8 (eight) hours as needed for anxiety. 12/12/19   Malik Ruffino, Mayer Masker, MD  Multiple Vitamins-Minerals (  MULTIVITAMIN ADULT PO) Take by mouth.    [provider]    Allergies    Strawberry extract  Review of Systems   Review of Systems  Constitutional: Negative for fever.  Respiratory: Negative for cough and shortness of breath.   Cardiovascular: Positive for palpitations. Negative for chest pain and leg swelling.  Gastrointestinal: Negative for abdominal pain, nausea and vomiting.  Genitourinary: Negative for dysuria.  Neurological: Positive for light-headedness. Negative for headaches.  All other systems reviewed and are negative.   Physical Exam Updated Vital Signs BP (!) 146/76   Pulse 63   Temp 98.6 F (37 C) (Oral)   Resp 12   Ht 1.753 m (5\' 9" )   Wt 61.2 kg   SpO2 100%   BMI 19.94 kg/m   Physical Exam Vitals and nursing note reviewed.  Constitutional:      Appearance: She is well-developed. She is not ill-appearing.  HENT:     Head: Normocephalic  and atraumatic.  Eyes:     Pupils: Pupils are equal, round, and reactive to light.  Cardiovascular:     Rate and Rhythm: Regular rhythm. Tachycardia present.     Heart sounds: Normal heart sounds.  Pulmonary:     Effort: Pulmonary effort is normal. No respiratory distress.     Breath sounds: No wheezing.  Chest:     Chest wall: No tenderness.  Abdominal:     General: Bowel sounds are normal.     Palpations: Abdomen is soft.  Musculoskeletal:     Cervical back: Neck supple.     Right lower leg: No tenderness. No edema.     Left lower leg: No tenderness. No edema.  Skin:    General: Skin is warm and dry.  Neurological:     Mental Status: She is alert and oriented to person, place, and time.  Psychiatric:        Mood and Affect: Mood normal.     Comments: Cranial nerves II through XII intact, fluent speech, normal gait     ED Results / Procedures / Treatments   Labs (all labs ordered are listed, but only abnormal results are displayed) Labs Reviewed  BASIC METABOLIC PANEL - Abnormal; Notable for the following components:      Result Value   Glucose, Bld 148 (*)    Creatinine, Ser 1.02 (*)    All other components within normal limits  CBC - Abnormal; Notable for the following components:   RBC 5.24 (*)    MCV 77.3 (*)    MCH 23.7 (*)    All other components within normal limits  D-DIMER, QUANTITATIVE (NOT AT [facility])  TSH  I-STAT BETA HCG BLOOD, ED ([facility], [facility], [facility] ONLY)  TROPONIN I (HIGH SENSITIVITY)  TROPONIN I (HIGH SENSITIVITY)    EKG EKG Interpretation  Date/Time:  Tuesday December 11 2019 18:35:31 EST Ventricular Rate:  124 PR Interval:  146 QRS Duration: 98 QT Interval:  308 QTC Calculation: 442 R Axis:   81 Text Interpretation: Sinus tachycardia Biatrial enlargement Minimal voltage criteria for LVH, may be normal variant ( Sokolow-Lyon ) Nonspecific ST abnormality Abnormal ECG No prior for comparison Confirmed by [id number] (Ross Marcus) on 12/12/2019 12:10:35  AM   Radiology DG Chest 2 View  Result Date: 12/11/2019 CLINICAL DATA:  Chest pain EXAM: CHEST - 2 VIEW COMPARISON:  None. FINDINGS: The lungs are hyperinflated. The heart size and mediastinal contours are within normal limits. Both lungs are clear. The visualized skeletal structures are unremarkable.  IMPRESSION: No acute cardiopulmonary disease. Electronically Signed   By: Ulyses Jarred M.D.   On: 12/11/2019 19:25    Procedures Procedures (including critical care time)  Medications Ordered in ED Medications  sodium chloride flush (NS) 0.9 % injection 3 mL (3 mLs Intravenous Given 12/12/19 0039)  sodium chloride 0.9 % bolus 1,000 mL (0 mLs Intravenous Stopped 12/12/19 0140)  LORazepam (ATIVAN) tablet 1 mg (1 mg Oral Given 12/12/19 0043)  acetaminophen (TYLENOL) tablet 650 mg (650 mg Oral Given 12/12/19 0143)    ED Course  I have reviewed the triage vital signs and the nursing notes.  Pertinent labs & imaging results that were available during my care of the patient were reviewed by me and considered in my medical decision making (see chart for details).    MDM Rules/Calculators/A&P                       Patient presents with palpitations and lightheadedness.  Also endorses increased stressors, anxiety, increased caffeine use.  She is overall nontoxic and vital signs initially notable for significant tachycardia.  No evidence of ischemia or arrhythmia.  Troponin is negative and doubt ACS.  Chest x-ray without evidence of pneumothorax or pneumonia.  Labs do indicate that she may be slightly dehydrated with slightly elevated creatinine.  Patient was given fluids.  Screening D-dimer is negative which makes blood clots less likely.  TSH is within a normal range.  Doubt thyroid emergency.  On recheck, patient feels much improved after fluids and Ativan.  She has primary physician follow-up later today.  Will discharge with short course of Ativan.  I have recommended decreased caffeine use and  good sleep hygiene as well.  After history, exam, and medical workup I feel the patient has been appropriately medically screened and is safe for discharge home. Pertinent diagnoses were discussed with the patient. Patient was given return precautions.  Final Clinical Impression(s) / ED Diagnoses Final diagnoses:  Palpitations  Dehydration  Anxiety    Rx / DC Orders ED Discharge Orders         Ordered    LORazepam (ATIVAN) 1 MG tablet  Every 8 hours PRN     12/12/19 0215           Merryl Hacker, MD 12/12/19 [phone]

## 2019-12-12 NOTE — Progress Notes (Signed)
   CHIEF COMPLAINT / HPI:  Patient presents for issues with heart palpitations.  Of note she was seen in the emergency room last night for these heart palpitations.  She had reported that she increased her caffeine intake and had been drinking a lot of tea.  Her episodes of palpitations and lightheadedness have been worsening since that time.  Patient reports that since she saw me in January she did not pick her prescription up that I sent to the pharmacy because it was sent to the wrong pharmacy.  I prescribed Prozac 20 mg daily which she has that she had relief from in the past and is interested in trying again.  She has had difficulty sleeping over the past month.  Over the past week she has had increased caffeine intake and every morning she stopped [employer] and gets a green tea.  She also has started taking a multivitamin recently that she just noticed is supposed to provide extra energy and may contain caffeine.  Patient reports that she did try exercise over the last month and that yoga helped with her anxiety and stress but that she is recently decreased doing that.  PERTINENT  PMH / PSH:    OBJECTIVE: BP (!) 142/80   Pulse 79   Wt 137 lb 12.8 oz (62.5 kg)   SpO2 100%   BMI 20.35 kg/m   General: Alert and oriented Cardio: Regular rate and rhythm, no murmurs appreciated Respiratory: Clear to auscultation bilaterally no wheezes appreciated Psych: Good mood, appears anxious at times.  More relaxed at the exam progressed.  Husband is present for the exam and he is her support person.  Denies SI or HI  ASSESSMENT / PLAN:  Anxiety and depression At my last visit I prescribed the patient Prozac 20 mg daily.  She was unable to pick this up because it was sent to the wrong pharmacy.  She says that since that time she has had continuation of issues with anxiety attacks which involve chest discomfort.  She has had multiple cardiac work-ups came back negative for any cardiac  issues. -Represcribed Prozac 20 mg daily -Counseled on the use of SSRIs and possible side effects -We will follow-up in 1 month -Discussed decreasing caffeine intake and proper sleep hygiene -Strict return precautions given   Derrel Nip, MD [facility]

## 2019-12-12 NOTE — Discharge Instructions (Addendum)
Make sure that you have a good sleep regimen.  Decrease caffeine use.  You may take Ativan as needed for anxiety but do not drive or operate heavy machinery while taking this medication.

## 2019-12-12 NOTE — Assessment & Plan Note (Signed)
At my last visit I prescribed the patient Prozac 20 mg daily.  She was unable to pick this up because it was sent to the wrong pharmacy.  She says that since that time she has had continuation of issues with anxiety attacks which involve chest discomfort.  She has had multiple cardiac work-ups came back negative for any cardiac issues. -Represcribed Prozac 20 mg daily -Counseled on the use of SSRIs and possible side effects -We will follow-up in 1 month -Discussed decreasing caffeine intake and proper sleep hygiene -Strict return precautions given

## 2019-12-19 ENCOUNTER — Telehealth: Payer: Self-pay

## 2019-12-19 NOTE — Telephone Encounter (Signed)
Patient LVM on nurse line stating she saw her EKG results from 2/16 ED visit. Patient is wondering what the wording means. Please advise.

## 2019-12-19 NOTE — Telephone Encounter (Signed)
Page- If she has any further questions, I am happy to call her. But since you were so kind and offered to call her with interpretation, I sent this back to you. Also, if this is more explanation than you bargained for, I will happily call.   ----------------------------------------------------------------------------------------------------  I agree, I would be scared if I read the EKG findings that she had. I will walk through each of them.   Sinus tachycardia: this means that her heart was beating quickly and the word sinus means that it is normal.   Biatrial enlargement:  When the leads (the sticky pieces) are stuck to the skin, they should be in very specific places. Sometimes, if the pieces are not placed correctly around the heart, it can read incorrectly and show structures are larger than they really are. Usually, we correlate this finding to how the patient looks in person. The MD did not have any concern with the way the patient looked, lab results, and medical history.   Minimal voltage criteria for LVH, may be normal variant ( Sokolow-Lyon ):  LVH is left ventricular hypertrophy, this means that the 4th chamber of the heart (that pumps blood into the body) could be enlarged. This is measured by adding up a few different lead values. LVH usually happens with chronic poorly controlled high blood pressure or other heart issues. The EKG said that this could possibly look like LVH, but the sum of the numbers is on the lower side. Again, this could also be due to the placement of the leads. Reading the chest x ray shows that the size of the heart appears normal.   Nonspecific ST abnormality:  This is a very broad term and covers many possible diagnoses. The more serious concerns were ruled out with other means of testing (labs, etc). This abnormality may occur when the heart is beating very fast, which her heart was.   Abnormal ECG:  the printed findings on the EKG are read by a computer.  However, we do not ever rely on the findings of the computer read because there is so much room for error.   Either way, I would recommend coming in for high blood pressure management (as it was high at your last few provider visits in your chart). We can check the EKG again to see if any of those findings resolved when she is having no palpatations.

## 2019-12-20 NOTE — Telephone Encounter (Signed)
Spoke with patient and gave details of EKG interpretation. Patient would like to bee seen as soon as she can to have a repeat EKG at our office and discuss BP. Patient is off 3/1, scheduled with [facility].

## 2019-12-20 NOTE — Telephone Encounter (Signed)
LVM for patient to return my call to discuss EKG results.

## 2019-12-24 ENCOUNTER — Encounter: Payer: Self-pay | Admitting: Family Medicine

## 2019-12-24 ENCOUNTER — Ambulatory Visit (INDEPENDENT_AMBULATORY_CARE_PROVIDER_SITE_OTHER): Admitting: Family Medicine

## 2019-12-24 ENCOUNTER — Other Ambulatory Visit: Payer: Self-pay

## 2019-12-24 ENCOUNTER — Ambulatory Visit (HOSPITAL_COMMUNITY)
Admission: RE | Admit: 2019-12-24 | Discharge: 2019-12-24 | Disposition: A | Discharge status: Home / Self Care | Source: Ambulatory Visit | Attending: Family Medicine | Admitting: Family Medicine

## 2019-12-24 VITALS — BP 130/58 | HR 82 | Wt 137.6 lb

## 2019-12-24 DIAGNOSIS — R9431 Abnormal electrocardiogram [ECG] [EKG]: Secondary | ICD-10-CM | POA: Insufficient documentation

## 2019-12-24 DIAGNOSIS — F329 Major depressive disorder, single episode, unspecified: Secondary | ICD-10-CM | POA: Insufficient documentation

## 2019-12-24 DIAGNOSIS — F419 Anxiety disorder, unspecified: Secondary | ICD-10-CM

## 2019-12-24 DIAGNOSIS — R002 Palpitations: Secondary | ICD-10-CM | POA: Diagnosis not present

## 2019-12-24 DIAGNOSIS — F32A Depression, unspecified: Secondary | ICD-10-CM

## 2019-12-24 MED ORDER — ESCITALOPRAM OXALATE 10 MG PO TABS: 10.0000 mg | ORAL_TABLET | Freq: Every day | ORAL | 2 refills | Status: DC

## 2019-12-24 NOTE — Progress Notes (Signed)
    SUBJECTIVE:   CHIEF COMPLAINT / HPI:   Depression/Anxiety Started once the pandemic started, unable to see family Most difficult part has been the inability to see her grandchildren Caring for her brother, moving during the pandemic was also very stressful Felt palpitations, fatigue, sleep has been difficult - can go to sleep easily but wakes up in the middle of the night Has had episodes for the past few months Has needed to take days off of work and has had multiple emergency department/urgent care visits due to the symptoms Deep breathing, yoga, husband's support, faith, walking have been helpful Has also noted menopausal symptoms recently such as hot flashes, vaginal dryness Started Prozac 20 mg daily 2/17 Started having severe headaches and strange dreams soon after starting Prozac Cannot remember if she had similar symptoms when she took Prozac in the past Took two days of Prozac and stopped due to these symptoms Due to her palpitations, an EKG was performed at her previous visit, which the computer read to be significant for possible biatrial enlargement, which patient is concerned about Patient reports having had an echo done in [place] before moving to [place], has never been told that she has heart problems   PERTINENT  PMH / PSH: Anxiety and depression, elevated blood pressure reading  OBJECTIVE:   BP (!) 130/58   Pulse 82   Wt 137 lb 9.6 oz (62.4 kg)   SpO2 100%   BMI 20.32 kg/m   General: well appearing, appears stated age, pleasant Cardiac: RRR, no MRG Respiratory: CTAB, no rhonchi, rales, or wheezing, normal work of breathing Psych: Appears mildly anxious with mildly anxious mood, normal thought process  GAD 7 : Generalized Anxiety Score 12/24/2019  Nervous, Anxious, on Edge 1  Control/stop worrying 1  Worry too much - different things 1  Trouble relaxing 1  Restless 0  Easily annoyed or irritable 1  Afraid - awful might happen 0  Total GAD 7 Score  5  Anxiety Difficulty Somewhat difficult   Depression screen [facility] 2/9 12/24/2019 12/24/2019 12/12/2019  Decreased Interest 1 1 2   Down, Depressed, Hopeless 1 1 2   PHQ - 2 Score 2 2 4   Altered sleeping - 2 3  Tired, decreased energy - 0 1  Change in appetite - 0 0  Feeling bad or failure about yourself  - 0 1  Trouble concentrating - 1 1  Moving slowly or fidgety/restless - 0 1  Suicidal thoughts - 0 0  PHQ-9 Score - 5 11  Difficult doing work/chores Somewhat difficult - -    ASSESSMENT/PLAN:   Anxiety and depression Acute on chronic.  Prognosis is good given adoption of lifestyle changes and support from husband and faith community.  Palpitations are likely a sequela of patient's anxiety.  Due to side effects of Prozac, will stop this medication.  After shared decision making, we decided to start Lexapro due to its safety, tolerability, and evidence for improvement of menopausal symptoms.  We will start Lexapro 10 mg once daily and follow-up in 2 to 4 weeks.  Due to patient's concerns about her prior EKG, a repeat EKG was done.  It appeared reassuring.  Patient was encouraged to send records from her previous echoes to our office so that we can look for any abnormality there.     , MD [facility]

## 2019-12-24 NOTE — Patient Instructions (Signed)
It was nice meeting you today Ms. Jasmine Rogers!  Your EKG looked reassuring today.  We will start Lexapro for your anxiety and menopausal symptoms.  Please take this once per day and give it time to build up - you may not notice a change at first, but it will likely be very helpful over time.  Please follow up in about 1 month or earlier if needed.  If you have any questions or concerns, please feel free to call the clinic.   Be well,  Dr. Frances Furbish

## 2019-12-25 NOTE — Assessment & Plan Note (Addendum)
Acute on chronic.  Prognosis is good given adoption of lifestyle changes and support from husband and faith community.  Palpitations are likely a sequela of patient's anxiety.  Due to side effects of Prozac, will stop this medication.  After shared decision making, we decided to start Lexapro due to its safety, tolerability, and evidence for improvement of menopausal symptoms.  We will start Lexapro 10 mg once daily and follow-up in 2 to 4 weeks.  Due to patient's concerns about her prior EKG, a repeat EKG was done.  It appeared reassuring.  Patient was encouraged to send records from her previous echoes to our office so that we can look for any abnormality there.

## 2020-01-08 ENCOUNTER — Other Ambulatory Visit: Payer: Self-pay | Admitting: Family Medicine

## 2020-02-15 ENCOUNTER — Other Ambulatory Visit: Payer: Self-pay

## 2020-02-15 ENCOUNTER — Ambulatory Visit (INDEPENDENT_AMBULATORY_CARE_PROVIDER_SITE_OTHER): Admitting: Family Medicine

## 2020-02-15 ENCOUNTER — Encounter: Payer: Self-pay | Admitting: Family Medicine

## 2020-02-15 DIAGNOSIS — F419 Anxiety disorder, unspecified: Secondary | ICD-10-CM | POA: Diagnosis not present

## 2020-02-15 DIAGNOSIS — R030 Elevated blood-pressure reading, without diagnosis of hypertension: Secondary | ICD-10-CM | POA: Diagnosis not present

## 2020-02-15 DIAGNOSIS — E789 Disorder of lipoprotein metabolism, unspecified: Secondary | ICD-10-CM

## 2020-02-15 DIAGNOSIS — R222 Localized swelling, mass and lump, trunk: Secondary | ICD-10-CM

## 2020-02-15 DIAGNOSIS — F329 Major depressive disorder, single episode, unspecified: Secondary | ICD-10-CM

## 2020-02-15 NOTE — Patient Instructions (Signed)
Dear Jasmine Rogers,   It was good to see you! Thank you for taking your time to come in to be seen. Today, we discussed the following:   Medication Management   I am so glad that you are doing well on your medication right now.  Let us continue it and follow-up in 3 months for check-in  High blood pressure  Please continue to monitor your blood pressures at home.  If that top number is consistently over 140 and the bottom numbers consistently over 90, I would like you to come in so that we could talk about blood pressure medications.  Come back at your earliest convenience to get your lipid panel.  We can also get it at your next check-in as well.  Please follow up in 3 months or sooner for concerning or worsening symptoms.   Be well,   Jasmine Rogers, M.D   [facility] [phone]  *Sign up for MyChart for instant access to your health profile, labs, orders, upcoming appointments or to contact your provider with questions*  ===================================================================================

## 2020-02-15 NOTE — Progress Notes (Signed)
    SUBJECTIVE:   CHIEF COMPLAINT / HPI:  Patient comes to clinic today to check in on a few items.   Medication   Patient was started on Lexapro and reports doing very well on it.  She reports that she is not having multiple trips to the ED for chest pain and palpating heart.  She reports that her anxiety has improved greatly.  Her GAD-7 today is 2.  She wants to discuss possibly coming off of the medication as she had previously discussed with Dr. Frances Furbish that she could consider coming off after about 3 months.  Patient denies any medication side effects  Bowel issues  Patient reports that she moves her bowels frequently, specifically, with each meal.  She denies any watery or loose stool.  She would like to make sure that this is okay.  Typically, she has 2-3 bowel movements a day.  All soft with no major change in consistency.  Knot  Patient also reports that she has a small bump under her right axilla.  She does not know how long it has been there for.  She noticed that during the shower.  She denies any pain in the area or to palpation.  She denies any gross or changes in the nodule.  She recently had her mammogram which returned normal. Heart   PERTINENT  PMH / PSH: Anxiety  OBJECTIVE:   BP 140/62   Pulse 68   Ht 5\' 9"  (1.753 m)   Wt 138 lb 12.8 oz (63 kg)   SpO2 100%   BMI 20.50 kg/m   General: Well-appearing fit female, no acute distress, Chest: Regular rate and rhythm, no murmurs appreciated no lower extremity edema Lungs: Clear to auscultation bilaterally. Abdomen: Positive bowel sounds. NTND. Right axilla: Mobile nodule appreciated lateral to patient's breast in mid axillary area.  The nodule is firm, soft, mobile.  There is no fluctuance or erythema in the area.  There is no tenderness to palpation.  There are no other palpable nodules in this area  ASSESSMENT/PLAN:   Elevated blood pressure reading Patient's blood pressure is 140/62 today.  She would like to still  hold off oN starting any blood pressure medications.  She reports that her blood pressures at home are under 140/90.  She denies any chest pain, vision changes, shortness of breath.  Patient will continue to monitor her blood pressures at home.  Counseled patient on importance of treating blood pressure while she is young to prevent future risk of cardiovascular disease.  Anxiety and depression Patient is doing very well on Lexapro at this time.  Guidelines recommend that patient stay on medication for at least 6 months even if she has improvement in her symptoms prior.  Discussed this with patient who is also agreeable.  We will continue to monitor.  Abnormal serum cholesterol We will recheck at next visit.   Subcutaneous nodule of chest wall Located lateral to right breast.  Half centimeter by half centimeter, smooth, firm, mobile nodule.  Patient's most recent mammogram was within normal limits.  Patient denies any pain in the area.  She denies any other nodules in the area.  She does not know how long this has been there for but has not grown.  We will continue to monitor given reassuring findings on physical exam.  If any growth, will ultrasound.     , MD [facility]

## 2020-02-17 ENCOUNTER — Encounter: Payer: Self-pay | Admitting: Family Medicine

## 2020-02-17 DIAGNOSIS — R222 Localized swelling, mass and lump, trunk: Secondary | ICD-10-CM | POA: Insufficient documentation

## 2020-02-17 NOTE — Assessment & Plan Note (Signed)
Patient is doing very well on Lexapro at this time.  Guidelines recommend that patient stay on medication for at least 6 months even if she has improvement in her symptoms prior.  Discussed this with patient who is also agreeable.  We will continue to monitor.

## 2020-02-17 NOTE — Assessment & Plan Note (Signed)
Patient's blood pressure is 140/62 today.  She would like to still hold off oN starting any blood pressure medications.  She reports that her blood pressures at home are under 140/90.  She denies any chest pain, vision changes, shortness of breath.  Patient will continue to monitor her blood pressures at home.  Counseled patient on importance of treating blood pressure while she is young to prevent future risk of cardiovascular disease.

## 2020-02-17 NOTE — Assessment & Plan Note (Signed)
Located lateral to right breast.  Half centimeter by half centimeter, smooth, firm, mobile nodule.  Patient's most recent mammogram was within normal limits.  Patient denies any pain in the area.  She denies any other nodules in the area.  She does not know how long this has been there for but has not grown.  We will continue to monitor given reassuring findings on physical exam.  If any growth, will ultrasound.

## 2020-02-17 NOTE — Assessment & Plan Note (Signed)
We will recheck at next visit.

## 2020-03-23 ENCOUNTER — Other Ambulatory Visit: Payer: Self-pay | Admitting: Family Medicine

## 2020-03-30 ENCOUNTER — Other Ambulatory Visit: Payer: Self-pay | Admitting: Family Medicine

## 2020-04-01 ENCOUNTER — Other Ambulatory Visit: Payer: Self-pay

## 2020-04-01 DIAGNOSIS — F419 Anxiety disorder, unspecified: Secondary | ICD-10-CM

## 2020-04-01 DIAGNOSIS — F32A Depression, unspecified: Secondary | ICD-10-CM

## 2020-04-01 NOTE — Telephone Encounter (Signed)
Patient calls nurse line requesting refill on lexapro. Patient reports running out of medication and is concerned that she will start having anxiety attacks again.   Forwarding to PCP for review  Veronda Prude, RN

## 2020-04-02 MED ORDER — ESCITALOPRAM OXALATE 10 MG PO TABS: 10.0000 mg | ORAL_TABLET | Freq: Every day | ORAL | 2 refills | Status: DC

## 2020-04-02 NOTE — Telephone Encounter (Signed)
Patient returns call to nurse line checking on status of rx refill.

## 2020-07-16 ENCOUNTER — Encounter: Payer: Self-pay | Admitting: Family Medicine

## 2020-07-16 ENCOUNTER — Other Ambulatory Visit: Payer: Self-pay

## 2020-07-16 ENCOUNTER — Ambulatory Visit (INDEPENDENT_AMBULATORY_CARE_PROVIDER_SITE_OTHER): Admitting: Family Medicine

## 2020-07-16 ENCOUNTER — Other Ambulatory Visit (HOSPITAL_COMMUNITY)
Admission: RE | Admit: 2020-07-16 | Discharge: 2020-07-16 | Disposition: A | Discharge status: Home / Self Care | Source: Ambulatory Visit | Attending: Family Medicine | Admitting: Family Medicine

## 2020-07-16 VITALS — BP 132/68 | HR 63 | Ht 69.0 in | Wt 142.1 lb

## 2020-07-16 DIAGNOSIS — R030 Elevated blood-pressure reading, without diagnosis of hypertension: Secondary | ICD-10-CM

## 2020-07-16 DIAGNOSIS — Z1159 Encounter for screening for other viral diseases: Secondary | ICD-10-CM | POA: Diagnosis not present

## 2020-07-16 DIAGNOSIS — F419 Anxiety disorder, unspecified: Secondary | ICD-10-CM

## 2020-07-16 DIAGNOSIS — E789 Disorder of lipoprotein metabolism, unspecified: Secondary | ICD-10-CM

## 2020-07-16 DIAGNOSIS — Z1231 Encounter for screening mammogram for malignant neoplasm of breast: Secondary | ICD-10-CM

## 2020-07-16 DIAGNOSIS — Z124 Encounter for screening for malignant neoplasm of cervix: Secondary | ICD-10-CM | POA: Diagnosis present

## 2020-07-16 DIAGNOSIS — Z23 Encounter for immunization: Secondary | ICD-10-CM | POA: Diagnosis not present

## 2020-07-16 DIAGNOSIS — F329 Major depressive disorder, single episode, unspecified: Secondary | ICD-10-CM

## 2020-07-16 DIAGNOSIS — R972 Elevated prostate specific antigen [PSA]: Secondary | ICD-10-CM

## 2020-07-16 NOTE — Progress Notes (Addendum)
SUBJECTIVE:   Chief compliant/HPI: annual examination  Jasmine Rogers is a 54 y.o. who presents today for an annual exam.   Anxiety  Wanting to decrease lexapro as she reports that she is feeling much improved over the last few months.  She does report that she has some anxiety and fast heartbeat when she lays down at night which she believes is due to possibly weaning from her medications.  She has recently started weaning medication by taking every other day.   Blood Pressure  Usually in the 120's-130's. Will be 140 if she is stressed.   History tabs reviewed and updated.   Review of systems form reviewed and notable for none.   OBJECTIVE:   BP 132/68   Pulse 63   Ht 5' 9" (1.753 m)   Wt 142 lb 2 oz (64.5 kg)   SpO2 98%   BMI 20.99 kg/m    Gen: NAD, alert, non-toxic, well-nourished, well-appearing, pleasant HEENT: Normocephaic, atraumatic. PERRLA, clear conjuctiva, no scleral icterus and injection. Normal EOM.  Hearing intact. TM pearly grey bilaterally with no fluid.  Neck supple with no LAD, nodules, or gross abnormality.  Nares patent with no discharge.  Maxillary and frontal sinuses nontender to palpation.  Oropharynx without erythema and lesions.  Tonsils nonswollen and without exudate.   CV: Regular rate and rhythm.  Normal S1-S2.  No murmur, gallops, S3, S4 appreciated.  Normal capillary refill bilaterally.  Radial pulses 2+ bilaterally. No bilateral lower extremity edema. Resp: Clear to auscultation bilaterally.  No wheezing, rales, rhonchi, or other abnormal lung sounds.  No increased work of breathing appreciated. Abd: Nontender and nondistended on palpation to all 4 quadrants.  Positive bowel sounds. Skin: No obvious rashes, lesions, or trauma.  Normal turgor.  MSK: Normal ROM. Normal strength and tone.  Neuro: Cranial nerves II through VI grossly intact. Gait normal.  Alert and oriented x4.  No obvious abnormal movements. Psych: Cooperative with exam.  Normal  speech. Pleasant. Makes good eye contact. GU: External vulva and vagina nonerythematous, without any obvious lesions or rash.  No abnormal discharge appreciated. Decreased ruggae of vaginal walls.  Cervix is non erythematous and non-friable.  There is no cervical motion tenderness, masses or gross abnormalities appreciated during bimanual exam.   ASSESSMENT/PLAN:   Abnormal serum cholesterol Cholesterol remains elevated. Rosuvastatin sent to pharmacy. Follow up in 6 months     Elevated blood pressure reading BP wnl today and patient continuing to monitor at home. Return parameters provided.         Anxiety and depression Patient is attempting to come off of her Lexapro right now.  She does report that she continues to have some feelings of anxiety before she goes to bed at night.  Patient has been on Lexapro for about 5 months now.  Given her continued anxiety, have recommended that she continue to take medication if she is still feeling anxious and attempt to wean again in a 1-2 months.  When weaning, she can decrease to 5 mg x 1 week and then 5 mg every other day for 1 week and discontinue.  Instructions provided.     Annual Examination  See AVS for age appropriate recommendations  PHQ score 1, reviewed and discussed.  BP reviewed and at goal.  Considered the following items based upon USPSTF recommendations: Diabetes screening: discussed and BMP obtained today Screening for elevated cholesterol: discussed HIV testing:  NR 07/18/19 Hepatitis C: ordered Hepatitis B:  done preivously Syphilis if  at high risk: discussed GC/CT not at high risk and not ordered. Osteoporosis screening considered based upon risk of fracture from [facility] calculator. Major osteoporotic fracture risk is 5.5%. DEXA not ordered.  Reviewed risk factors for latent tuberculosis and not indicated  Discussed family history, BRCA testing not indicated. Used.  Cervical cancer screening: due for Pap today, cytology + HPV  ordered Breast cancer screening: discussed and ordered mammogram based upon personal history  Colorectal cancer screening: up to date on screening for CRC. Lung cancer screening: discussed. See documentation below regarding indications/risks/benefits.  Vaccinations UTD .   Follow up in 1 year or sooner if indicated.  Patient reports that she would like to continue following here for her care as we have built a good physician-patient relationship, however, have also informed patient that I will not be at this practice next year.  Patient may switch to PCP that is closer to her current location.  Wilber Oliphant, MD [place]

## 2020-07-16 NOTE — Patient Instructions (Signed)
It was good to see you today!  I am checking labs for you today and I will call you with any abnormal results.  Otherwise, I will send results and explanations for your MyChart.  You are up-to-date with all of your screenings.  For the Lexapro, you can decrease to 5 mg daily for 1 week, then take 5 mg every other day for 1 week.  After that, you can come off of it entirely.  If you have any issues coming off of medication please call us.

## 2020-07-17 LAB — BASIC METABOLIC PANEL
BUN/Creatinine Ratio: 16 (ref 9–23)
BUN: 14 mg/dL (ref 6–24)
CO2: 25 mmol/L (ref 20–29)
Calcium: 9.9 mg/dL (ref 8.7–10.2)
Chloride: 102 mmol/L (ref 96–106)
Creatinine, Ser: 0.85 mg/dL (ref 0.57–1.00)
GFR calc Af Amer: 90 mL/min/{1.73_m2} (ref >59)
GFR calc non Af Amer: 78 mL/min/{1.73_m2} (ref >59)
Glucose: 91 mg/dL (ref 65–99)
Potassium: 4.4 mmol/L (ref 3.5–5.2)
Sodium: 142 mmol/L (ref 134–144)

## 2020-07-17 LAB — CYTOLOGY - PAP
Chlamydia: NEGATIVE
Comment: NEGATIVE
Comment: NEGATIVE
Comment: NORMAL
Diagnosis: NEGATIVE
High risk HPV: NEGATIVE
Neisseria Gonorrhea: NEGATIVE

## 2020-07-17 LAB — LIPID PANEL
Chol/HDL Ratio: 2.4 ratio (ref 0.0–4.4)
Cholesterol, Total: 263 mg/dL — ABNORMAL HIGH (ref 100–199)
HDL: 109 mg/dL (ref >39)
LDL Chol Calc (NIH): 148 mg/dL — ABNORMAL HIGH (ref 0–99)
Triglycerides: 39 mg/dL (ref 0–149)
VLDL Cholesterol Cal: 6 mg/dL (ref 5–40)

## 2020-07-17 LAB — HEPATITIS C ANTIBODY: Hep C Virus Ab: <0.1 s/co ratio (ref 0.0–0.9)

## 2020-07-17 LAB — TSH: TSH: 0.806 u[IU]/mL (ref 0.450–4.500)

## 2020-07-18 MED ORDER — ROSUVASTATIN CALCIUM 20 MG PO TABS: 20.0000 mg | ORAL_TABLET | Freq: Every day | ORAL | 3 refills | Status: DC

## 2020-07-18 NOTE — Assessment & Plan Note (Signed)
Patient does report weak stream. Additionally, patient with first-degree relative who passed away from prostate cancer. PSA obtained was 4.3. Spoke to patient over the phone about referral to urology.

## 2020-07-18 NOTE — Assessment & Plan Note (Addendum)
Cholesterol remains elevated. Rosuvastatin sent to pharmacy. Follow up in 6 months

## 2020-07-20 ENCOUNTER — Other Ambulatory Visit: Payer: Self-pay | Admitting: Family Medicine

## 2020-07-20 ENCOUNTER — Encounter: Payer: Self-pay | Admitting: Family Medicine

## 2020-07-20 NOTE — Assessment & Plan Note (Signed)
BP wnl today and patient continuing to monitor at home. Return parameters provided.

## 2020-07-20 NOTE — Assessment & Plan Note (Signed)
Patient is attempting to come off of her Lexapro right now.  She does report that she continues to have some feelings of anxiety before she goes to bed at night.  Patient has been on Lexapro for about 5 months now.  Given her continued anxiety, have recommended that she continue to take medication if she is still feeling anxious and attempt to wean again in a 1-2 months.  When weaning, she can decrease to 5 mg x 1 week and then 5 mg every other day for 1 week and discontinue.  Instructions provided.

## 2020-08-06 ENCOUNTER — Other Ambulatory Visit: Payer: Self-pay

## 2020-08-06 DIAGNOSIS — F419 Anxiety disorder, unspecified: Secondary | ICD-10-CM

## 2020-08-07 ENCOUNTER — Other Ambulatory Visit: Payer: Self-pay | Admitting: Family Medicine

## 2020-08-07 DIAGNOSIS — F419 Anxiety disorder, unspecified: Secondary | ICD-10-CM

## 2020-08-07 MED ORDER — ESCITALOPRAM OXALATE 10 MG PO TABS: 10.0000 mg | ORAL_TABLET | Freq: Every day | ORAL | 2 refills | Status: DC

## 2020-09-01 ENCOUNTER — Other Ambulatory Visit: Payer: Self-pay | Admitting: Family Medicine

## 2020-09-01 DIAGNOSIS — Z1231 Encounter for screening mammogram for malignant neoplasm of breast: Secondary | ICD-10-CM

## 2020-10-15 ENCOUNTER — Ambulatory Visit
Admission: RE | Admit: 2020-10-15 | Discharge: 2020-10-15 | Disposition: A | Discharge status: Home / Self Care | Payer: Self-pay | Source: Ambulatory Visit | Attending: Family Medicine | Admitting: Family Medicine

## 2020-10-15 ENCOUNTER — Other Ambulatory Visit: Payer: Self-pay

## 2020-10-15 DIAGNOSIS — Z1231 Encounter for screening mammogram for malignant neoplasm of breast: Secondary | ICD-10-CM

## 2021-04-05 ENCOUNTER — Other Ambulatory Visit: Payer: Self-pay | Admitting: Family Medicine

## 2021-04-05 DIAGNOSIS — F419 Anxiety disorder, unspecified: Secondary | ICD-10-CM

## 2021-04-06 NOTE — Telephone Encounter (Signed)
Patient last seen September 2021 for this medication.  At that time, she was weaning off of this medication due to improvement of her symptoms.  She is now requesting 90-day supply. Please let patient know that she will need to be seen in clinic for further refills.

## 2021-04-07 NOTE — Telephone Encounter (Signed)
Called and scheduled appointment.

## 2021-04-15 ENCOUNTER — Encounter: Payer: Self-pay | Admitting: Family Medicine

## 2021-04-15 ENCOUNTER — Other Ambulatory Visit: Payer: Self-pay

## 2021-04-15 ENCOUNTER — Ambulatory Visit (INDEPENDENT_AMBULATORY_CARE_PROVIDER_SITE_OTHER): Admitting: Family Medicine

## 2021-04-15 VITALS — BP 138/70 | HR 75 | Wt 152.4 lb

## 2021-04-15 DIAGNOSIS — Z1231 Encounter for screening mammogram for malignant neoplasm of breast: Secondary | ICD-10-CM | POA: Diagnosis not present

## 2021-04-15 DIAGNOSIS — E782 Mixed hyperlipidemia: Secondary | ICD-10-CM | POA: Diagnosis not present

## 2021-04-15 DIAGNOSIS — R030 Elevated blood-pressure reading, without diagnosis of hypertension: Secondary | ICD-10-CM

## 2021-04-15 DIAGNOSIS — F419 Anxiety disorder, unspecified: Secondary | ICD-10-CM

## 2021-04-15 DIAGNOSIS — F32A Depression, unspecified: Secondary | ICD-10-CM

## 2021-04-15 DIAGNOSIS — Z1211 Encounter for screening for malignant neoplasm of colon: Secondary | ICD-10-CM | POA: Diagnosis not present

## 2021-04-15 DIAGNOSIS — E789 Disorder of lipoprotein metabolism, unspecified: Secondary | ICD-10-CM

## 2021-04-15 NOTE — Assessment & Plan Note (Signed)
Continue Lexapro at half tablet every other day as this seems to be working very well for her.

## 2021-04-15 NOTE — Progress Notes (Signed)
    SUBJECTIVE:   CHIEF COMPLAINT / HPI:   Anxiety  Continues to wean her medciation. Tried to wean further, but started to have palpitations so went back to 1/2 tablets EOD. Was on 1/2 every day until a week ago.   Blood pressure  Patient reports taking her blood pressures at home.  This morning 120/76.  Today, she had an elevated blood pressure 155/72 and on recheck 138/72.  She reports her blood pressures at home are typically in the 120s over 70s.  Hyperlipidemia Patient currently taking Crestor 20 mg.  No side effects.  Patient had desired coming off of this medication in the past that she does not like taking medications. Will check lipid panel today.  If there is improvement, I would recommend continuing this medication.  Though, she can also discuss with her future PCP trialing off of medication as well.  Health maintenance Patient is requesting orders for colonoscopy.  Last colonoscopy was done in [place] 5 years ago.  Also requesting mammography.  Last mammo last year was normal.  PERTINENT  PMH / PSH: Anxiety   OBJECTIVE:   BP 138/70   Pulse 75   Wt 152 lb 6.4 oz (69.1 kg)   SpO2 100%   BMI 22.51 kg/m   General: NAD, non-toxic, well-appearing, sitting comfortably in chair   HEENT: [place]/AT. PERRLA. EOMI.  Cardiovascular: RRR, normal S1, S2. B/L 2+ RP. no BLEE Respiratory: CTAB. No IWOB.  Abdomen: + BS. NT, ND, soft to palpation.  Extremities: Warm and well perfused. Moving spontaneously.  Integumentary: No obvious rashes, lesions, trauma on general exam. Neuro: A & O x4. CN grossly intact. No FND   ASSESSMENT/PLAN:   Elevated blood pressure reading Blood pressure 155/72 initially.  On recheck, 138/72.  Patient reports blood pressures at home are typically 120s over 70s.  Encourage patient to continue taking her blood pressures at home.   Anxiety and depression Continue Lexapro at half tablet every other day as this seems to be working very well for  her.  Abnormal serum cholesterol Will check lipid panel today.  If there is improvement, I would recommend continuing this medication.  Though, she can also discuss with her future PCP trialing off of medication as well.   Melene Plan, MD [facility]

## 2021-04-15 NOTE — Assessment & Plan Note (Signed)
Will check lipid panel today.  If there is improvement, I would recommend continuing this medication.  Though, she can also discuss with her future PCP trialing off of medication as well.

## 2021-04-15 NOTE — Assessment & Plan Note (Signed)
Blood pressure 155/72 initially.  On recheck, 138/72.  Patient reports blood pressures at home are typically 120s over 70s.  Encourage patient to continue taking her blood pressures at home.

## 2021-04-15 NOTE — Patient Instructions (Signed)
It was so good to see you today!  We will check your lipid panel today and I will call you with the results.  In the meantime, I would recommend continuing your medications as you are taking them. I wish you all the best and take care of yourself!  Dr. Selena Batten

## 2021-04-16 LAB — LIPID PANEL
Chol/HDL Ratio: 1.7 ratio (ref 0.0–4.4)
Cholesterol, Total: 179 mg/dL (ref 100–199)
HDL: 107 mg/dL (ref >39)
LDL Chol Calc (NIH): 65 mg/dL (ref 0–99)
Triglycerides: 30 mg/dL (ref 0–149)
VLDL Cholesterol Cal: 7 mg/dL (ref 5–40)

## 2021-04-28 ENCOUNTER — Encounter: Payer: Self-pay | Admitting: Family Medicine

## 2021-04-28 ENCOUNTER — Ambulatory Visit (INDEPENDENT_AMBULATORY_CARE_PROVIDER_SITE_OTHER): Admitting: Family Medicine

## 2021-04-28 ENCOUNTER — Ambulatory Visit
Admission: RE | Admit: 2021-04-28 | Discharge: 2021-04-28 | Disposition: A | Discharge status: Home / Self Care | Source: Ambulatory Visit | Attending: Family Medicine | Admitting: Family Medicine

## 2021-04-28 ENCOUNTER — Other Ambulatory Visit: Payer: Self-pay

## 2021-04-28 VITALS — BP 107/80 | HR 84 | Ht 69.0 in | Wt 150.6 lb

## 2021-04-28 DIAGNOSIS — M25561 Pain in right knee: Secondary | ICD-10-CM

## 2021-04-28 DIAGNOSIS — M25562 Pain in left knee: Secondary | ICD-10-CM

## 2021-04-28 DIAGNOSIS — G8929 Other chronic pain: Secondary | ICD-10-CM

## 2021-04-28 NOTE — Progress Notes (Signed)
    SUBJECTIVE:   CHIEF COMPLAINT / HPI:   Knee pain Had a fitness class last Tuesday and has had pain in her bilateral knees since then Activities involved a lot of bending, squatting and twisting motions. 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 Used to do track and other sports  PERTINENT  PMH / PSH: non-contributory  OBJECTIVE:   BP 107/80   Pulse 84   Ht 5\' 9"  (1.753 m)   Wt 150 lb 9.6 oz (68.3 kg)   SpO2 100%   BMI 22.24 kg/m   General: Fit middle-aged female, NAD CV: RRR, no murmurs Pulm: CTAB, no wheezes or rales MSK: No significant joint tenderness, no significant effusion, negative Lachman/posterior drawer, negative varus/valgus stress test, crepitus appreciated with McMurray's maneuver bilaterally without significant pain  ASSESSMENT/PLAN:   Acute on chronic bilateral knee pain Patient reports knee issues for several years but acute worsening of pain after working out 1 week ago.  Likely with underlying OA but possible meniscal involvement based on symptoms though mild. - NSAIDs - XR bilateral knees standing - sports medicine referral per patient request   , MD [facility]

## 2021-04-28 NOTE — Patient Instructions (Addendum)
It was nice seeing you today!  Keep taking Motrin as needed for pain and swelling.  I am ordering Xrays of your knees. You do not need an appointment.  [facility] Address: [address], [place], [place] [address] Phone: [phone]   Follow-up in 1 month. If not improved, we can discuss referral to sports medicine.  Stay well, Littie Deeds, MD [facility] [phone]

## 2021-05-02 ENCOUNTER — Other Ambulatory Visit: Payer: Self-pay | Admitting: Family Medicine

## 2021-05-02 DIAGNOSIS — F419 Anxiety disorder, unspecified: Secondary | ICD-10-CM

## 2021-05-02 DIAGNOSIS — F32A Depression, unspecified: Secondary | ICD-10-CM

## 2021-05-07 ENCOUNTER — Ambulatory Visit (INDEPENDENT_AMBULATORY_CARE_PROVIDER_SITE_OTHER): Admitting: Family Medicine

## 2021-05-07 ENCOUNTER — Encounter: Payer: Self-pay | Admitting: Family Medicine

## 2021-05-07 ENCOUNTER — Other Ambulatory Visit: Payer: Self-pay

## 2021-05-07 ENCOUNTER — Ambulatory Visit: Payer: Self-pay

## 2021-05-07 VITALS — BP 128/72 | Ht 69.0 in | Wt 150.0 lb

## 2021-05-07 DIAGNOSIS — M222X2 Patellofemoral disorders, left knee: Secondary | ICD-10-CM

## 2021-05-07 DIAGNOSIS — M222X1 Patellofemoral disorders, right knee: Secondary | ICD-10-CM | POA: Diagnosis not present

## 2021-05-07 MED ORDER — DICLOFENAC SODIUM 1 % EX GEL: 4.0000 g | Freq: Four times a day (QID) | CUTANEOUS | 11 refills | Status: AC

## 2021-05-07 NOTE — Progress Notes (Signed)
Jasmine Rogers - 55 y.o. female MRN [id number]  Date of birth: [birth year]  SUBJECTIVE:  Including CC & ROS.  No chief complaint on file.   Jasmine Rogers is a 55 y.o. female that is presenting with acute on chronic bilateral knee pain.  Pain is anterior in nature.  Seems to be worse with walking and a long periods of time..  Independent review of the bilateral knee x-ray from 7/5 shows minor medial sided degenerative change bilaterally.   Review of Systems See HPI   HISTORY: Past Medical, Surgical, Social, and Family History Reviewed & Updated per EMR.   Pertinent Historical Findings include:  History reviewed. No pertinent past medical history.  Past Surgical History:  Procedure Laterality Date   BREAST CYST EXCISION Right    BREAST MASS EXCISION Bilateral    Benign tumors. Total of 2 surgeries with 3 mass removals.   COLONOSCOPY W/ BIOPSIES  08/14/2013   removal of polyp.     Family History  Problem Relation Age of Onset   Alzheimer's disease Mother    Hyperlipidemia Father    Alcohol abuse Brother        ETOH of drug abuse, not specifed     Social History   Socioeconomic History   Marital status: Married    Spouse name: Not on file   Number of children: Not on file   Years of education: Not on file   Highest education level: Not on file  Occupational History   Occupation: Dialysis technician     Comment: IT trainer [employer]   Tobacco Use   Smoking status: Never   Smokeless tobacco: Never  Substance and Sexual Activity   Alcohol use: Yes    Alcohol/week: 7.0 standard drinks    Types: 7 Glasses of wine per week    Comment: 1 glass of wine daily    Drug use: Never   Sexual activity: Yes  Other Topics Concern   Not on file  Social History Narrative   Lived in [place] years with husband who is now retired and she moved back to [place] where she was born and raised.    3 kids, 34 & 32 & 32 all girls.       Husband is age 45,  no pets at home.    Education: finished some college   Previous doctor: Dr. Jacklynn Bue - Family Medicine 01/2018. Dr. Cliffton Asters - Optometry    Social Determinants of Health   Financial Resource Strain: Not on file  Food Insecurity: Not on file  Transportation Needs: Not on file  Physical Activity: Not on file  Stress: Not on file  Social Connections: Not on file  Intimate Partner Violence: Not on file     PHYSICAL EXAM:  VS: BP 128/72 (BP Location: Left Arm, Patient Position: Sitting, Cuff Size: Normal)   Ht 5\' 9"  (1.753 m)   Wt 150 lb (68 kg)   BMI 22.15 kg/m  Physical Exam Gen: NAD, alert, cooperative with exam, well-appearing MSK:  Right and left knee: No obvious effusion. Normal strength resistance. Weakness with hip abduction. Neurovascular intact  Limited ultrasound: Right knee, left knee:  Right knee: Mild effusion. Normal-appearing quadricep and patellar tendon. Mild medial joint space narrowing. Normal-appearing lateral joint space.  Left knee: Mild effusion. Normal-appearing quadricep and patellar tendon. Normal-appearing medial lateral joint space.  Summary: Mild effusion bilaterally  Ultrasound and interpretation by , MD    ASSESSMENT & PLAN:   Patellofemoral pain syndrome of  both knees Symptoms seem more consistent with patellofemoral as opposed to degenerative changes. -Counseled on home exercise therapy and supportive care. -Voltaren. -Could consider injection or physical therapy.

## 2021-05-07 NOTE — Patient Instructions (Signed)
Nice to meet you Please try ice  Please try voltaren  Please try the exercises   Please send me a message in MyChart with any questions or updates.  Please see me back in 4 weeks.   --Dr. Neyah Ellerman  

## 2021-05-07 NOTE — Assessment & Plan Note (Signed)
Symptoms seem more consistent with patellofemoral as opposed to degenerative changes. -Counseled on home exercise therapy and supportive care. -Voltaren. -Could consider injection or physical therapy.

## 2021-06-10 ENCOUNTER — Ambulatory Visit (INDEPENDENT_AMBULATORY_CARE_PROVIDER_SITE_OTHER): Admitting: Family Medicine

## 2021-06-10 ENCOUNTER — Other Ambulatory Visit: Payer: Self-pay

## 2021-06-10 ENCOUNTER — Encounter: Payer: Self-pay | Admitting: Family Medicine

## 2021-06-10 DIAGNOSIS — M222X2 Patellofemoral disorders, left knee: Secondary | ICD-10-CM | POA: Diagnosis not present

## 2021-06-10 DIAGNOSIS — M222X1 Patellofemoral disorders, right knee: Secondary | ICD-10-CM

## 2021-06-10 NOTE — Assessment & Plan Note (Signed)
Has been doing well with treatment thus far.  Pain is minimal -Counseled on home exercise therapy and supportive care. -Green sport insoles. -Could consider injection or physical therapy.

## 2021-06-10 NOTE — Progress Notes (Signed)
  Jasmine Rogers - 55 y.o. female MRN [id number]  Date of birth: 1966-08-29  SUBJECTIVE:  Including CC & ROS.  No chief complaint on file.   Jasmine Rogers is a 55 y.o. female that is following up for her bilateral knee pain.  She has been doing well with the exercises and Voltaren.  Has some popping from time to time.   Review of Systems See HPI   HISTORY: Past Medical, Surgical, Social, and Family History Reviewed & Updated per EMR.   Pertinent Historical Findings include:  History reviewed. No pertinent past medical history.  Past Surgical History:  Procedure Laterality Date   BREAST CYST EXCISION Right    BREAST MASS EXCISION Bilateral    Benign tumors. Total of 2 surgeries with 3 mass removals.   COLONOSCOPY W/ BIOPSIES  08/14/2013   removal of polyp.     Family History  Problem Relation Age of Onset   Alzheimer's disease Mother    Hyperlipidemia Father    Alcohol abuse Brother        ETOH of drug abuse, not specifed     Social History   Socioeconomic History   Marital status: Married    Spouse name: Not on file   Number of children: Not on file   Years of education: Not on file   Highest education level: Not on file  Occupational History   Occupation: Dialysis technician     Comment: IT trainer [employer]   Tobacco Use   Smoking status: Never   Smokeless tobacco: Never  Substance and Sexual Activity   Alcohol use: Yes    Alcohol/week: 7.0 standard drinks    Types: 7 Glasses of wine per week    Comment: 1 glass of wine daily    Drug use: Never   Sexual activity: Yes  Other Topics Concern   Not on file  Social History Narrative   Lived in [place] for 15[place]16 years with husband who is now retired and she moved back to [place] where she was born and raised.    3 kids, 34 & 32 & 32 all girls.       Husband is age 52, no pets at home.    Education: finished some college   Previous doctor: Dr. Jacklynn Bue - Family Medicine 01/2018. Dr. Cliffton Asters -  Optometry    Social Determinants of Health   Financial Resource Strain: Not on file  Food Insecurity: Not on file  Transportation Needs: Not on file  Physical Activity: Not on file  Stress: Not on file  Social Connections: Not on file  Intimate Partner Violence: Not on file     PHYSICAL EXAM:  VS: BP 118/80 (BP Location: Left Arm, Patient Position: Sitting, Cuff Size: Normal)   Ht 5\' 9"  (1.753 m)   Wt 150 lb (68 kg)   BMI 22.15 kg/m  Physical Exam Gen: NAD, alert, cooperative with exam, well-appearing    ASSESSMENT & PLAN:   Patellofemoral pain syndrome of both knees Has been doing well with treatment thus far.  Pain is minimal -Counseled on home exercise therapy and supportive care. -Green sport insoles. -Could consider injection or physical therapy.

## 2021-06-12 ENCOUNTER — Other Ambulatory Visit: Payer: Self-pay

## 2021-06-12 DIAGNOSIS — F32A Depression, unspecified: Secondary | ICD-10-CM

## 2021-06-12 DIAGNOSIS — F419 Anxiety disorder, unspecified: Secondary | ICD-10-CM

## 2021-06-16 MED ORDER — ESCITALOPRAM OXALATE 10 MG PO TABS: ORAL_TABLET | ORAL | 0 refills | Status: AC

## 2021-08-14 ENCOUNTER — Telehealth: Payer: Self-pay | Admitting: Gastroenterology

## 2021-09-29 ENCOUNTER — Other Ambulatory Visit: Payer: Self-pay | Admitting: Family Medicine

## 2021-10-22 ENCOUNTER — Other Ambulatory Visit: Payer: Self-pay

## 2021-10-22 ENCOUNTER — Ambulatory Visit
Admission: RE | Admit: 2021-10-22 | Discharge: 2021-10-22 | Disposition: A | Discharge status: Home / Self Care | Source: Ambulatory Visit | Attending: Family Medicine | Admitting: Family Medicine

## 2021-10-22 ENCOUNTER — Encounter: Payer: Self-pay | Admitting: Family Medicine

## 2021-10-22 DIAGNOSIS — Z1231 Encounter for screening mammogram for malignant neoplasm of breast: Secondary | ICD-10-CM

## 2021-12-18 ENCOUNTER — Emergency Department (HOSPITAL_BASED_OUTPATIENT_CLINIC_OR_DEPARTMENT_OTHER)

## 2021-12-18 ENCOUNTER — Other Ambulatory Visit: Payer: Self-pay

## 2021-12-18 ENCOUNTER — Encounter (HOSPITAL_BASED_OUTPATIENT_CLINIC_OR_DEPARTMENT_OTHER): Payer: Self-pay

## 2021-12-18 DIAGNOSIS — Z5321 Procedure and treatment not carried out due to patient leaving prior to being seen by health care provider: Secondary | ICD-10-CM | POA: Diagnosis not present

## 2021-12-18 DIAGNOSIS — R079 Chest pain, unspecified: Secondary | ICD-10-CM | POA: Insufficient documentation

## 2021-12-18 NOTE — ED Triage Notes (Signed)
Patient complains of pain under L breast for 4-5 days.  She states it became sharp tonight.  Pain is worse when taking a deep breath, and better when splinting the ribs.  She states she took Scientist, research (life sciences) and pepto bismol earlier in the day.

## 2021-12-19 ENCOUNTER — Emergency Department (HOSPITAL_BASED_OUTPATIENT_CLINIC_OR_DEPARTMENT_OTHER)
Admission: EM | Admit: 2021-12-19 | Discharge: 2021-12-19 | Discharge status: Against Medical Advice | Attending: Emergency Medicine | Admitting: Emergency Medicine

## 2021-12-19 NOTE — ED Notes (Signed)
Per registration, patient decided to leave after triage.  Unknown reason.

## 2022-04-05 IMAGING — MG MM DIGITAL SCREENING BILAT W/ TOMO AND CAD
8 series · 9 of 24 positions shown · non-contrast
Comparison: Previous exam(s).

CLINICAL DATA: Screening.

EXAM:
DIGITAL SCREENING BILATERAL MAMMOGRAM WITH TOMOSYNTHESIS AND CAD
TECHNIQUE: Bilateral screening digital craniocaudal and mediolateral oblique
mammograms were obtained. Bilateral screening digital breast
tomosynthesis was performed. The images were evaluated with
computer-aided detection.

[L MLO synth-2D]
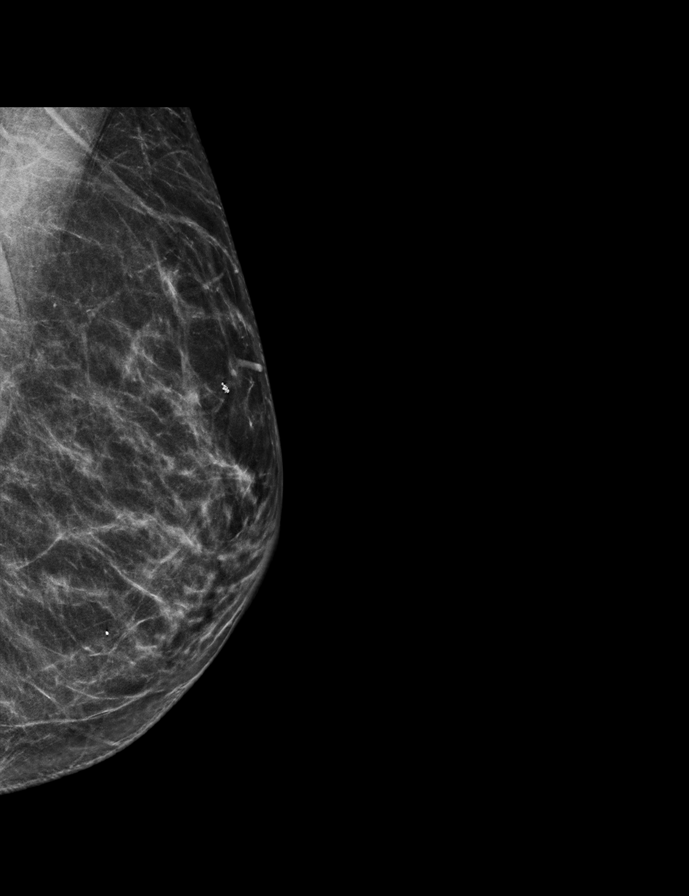

[R MLO synth-2D]
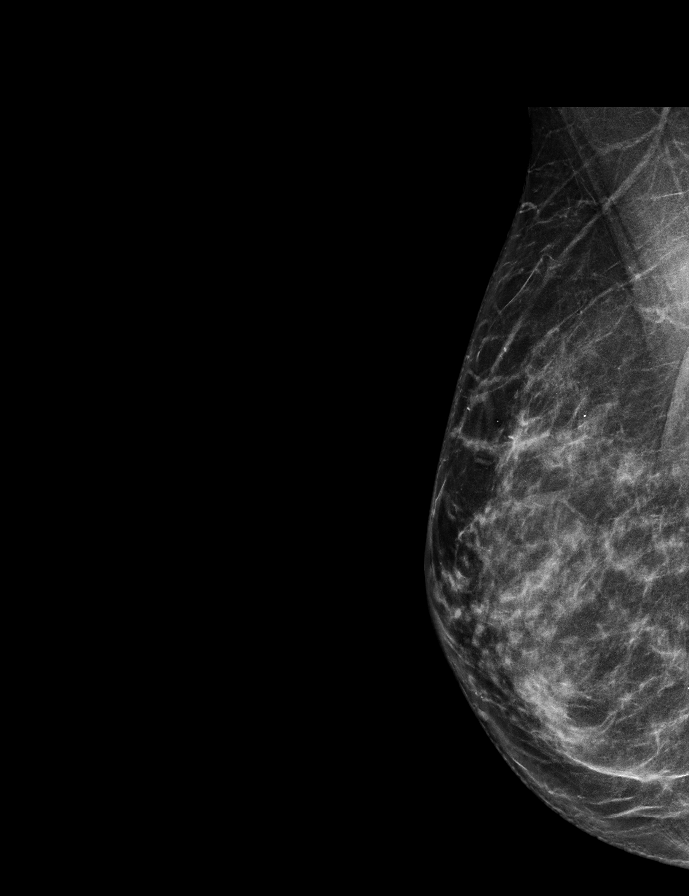

[L CC synth-2D]
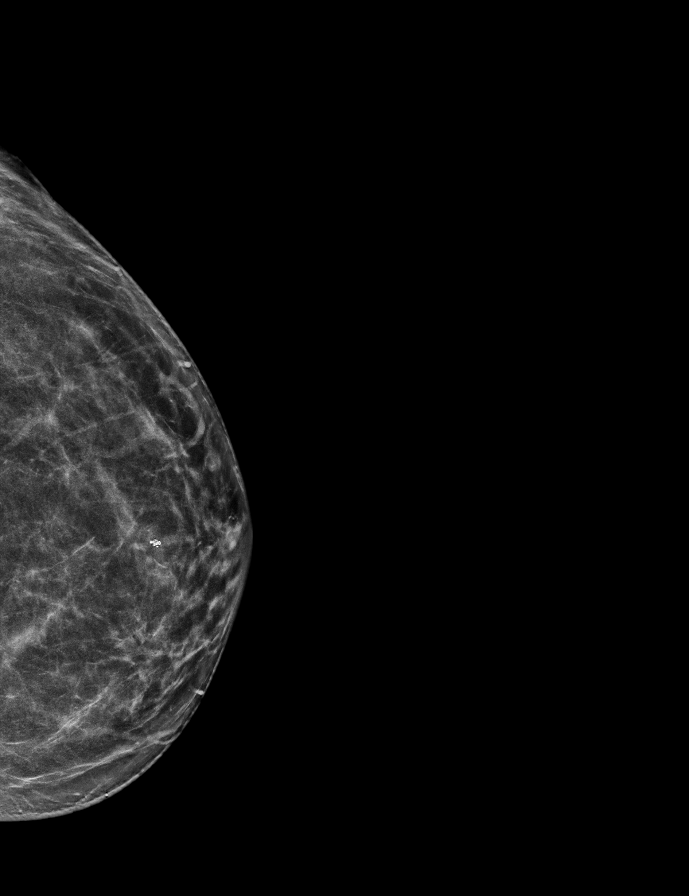

[R CC synth-2D]
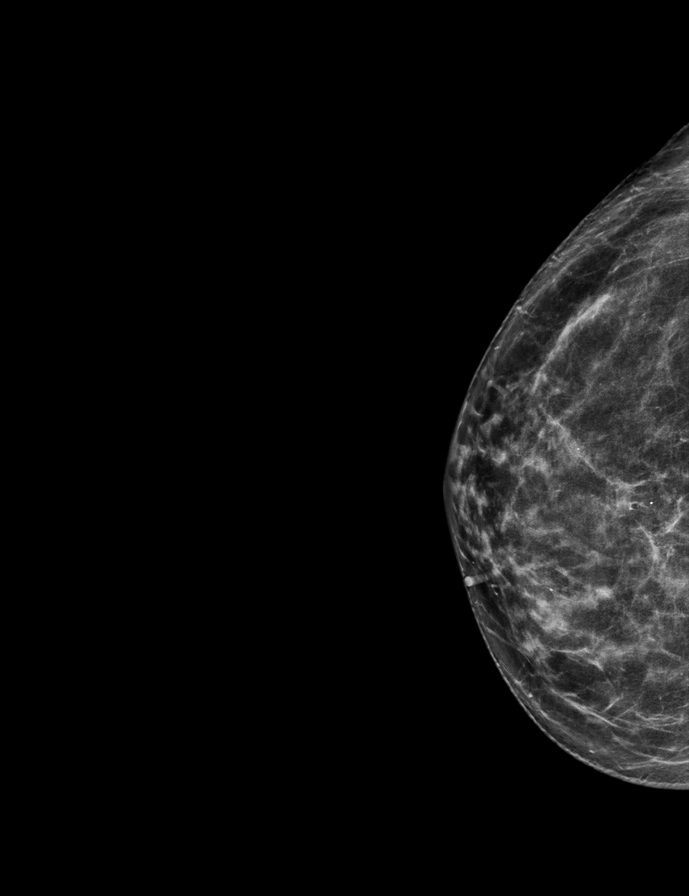

[R MLO tomo · 2 of 66 frames shown]
[frame 22/66]
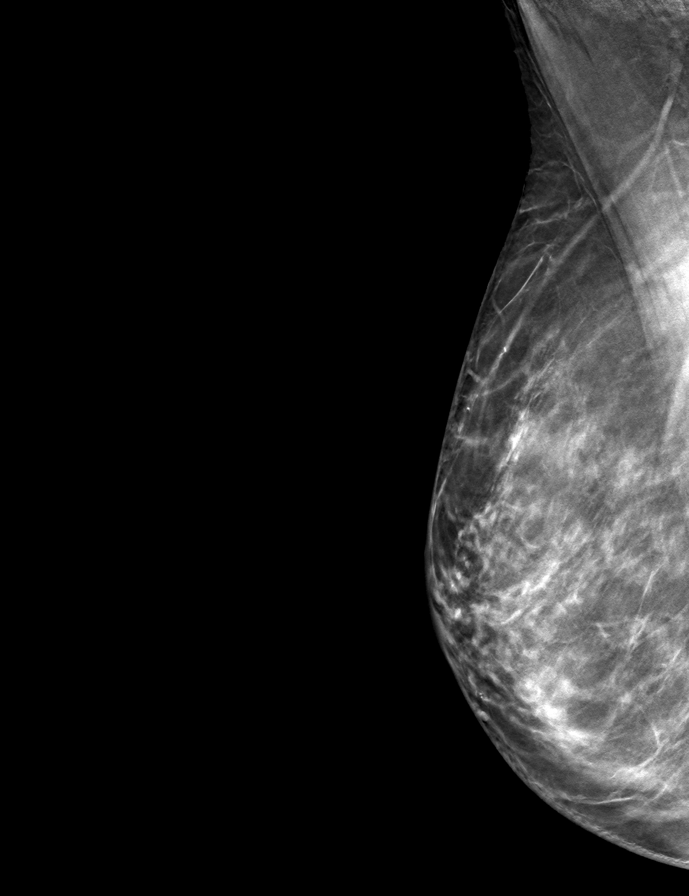
[frame 33/66]
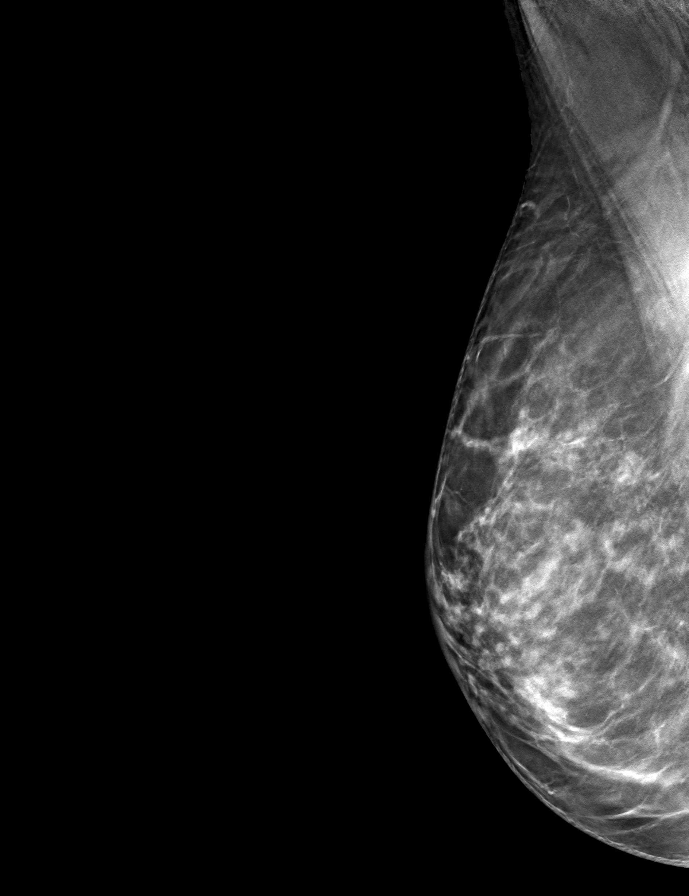

[R CC tomo · tomo slice 33/65.0]
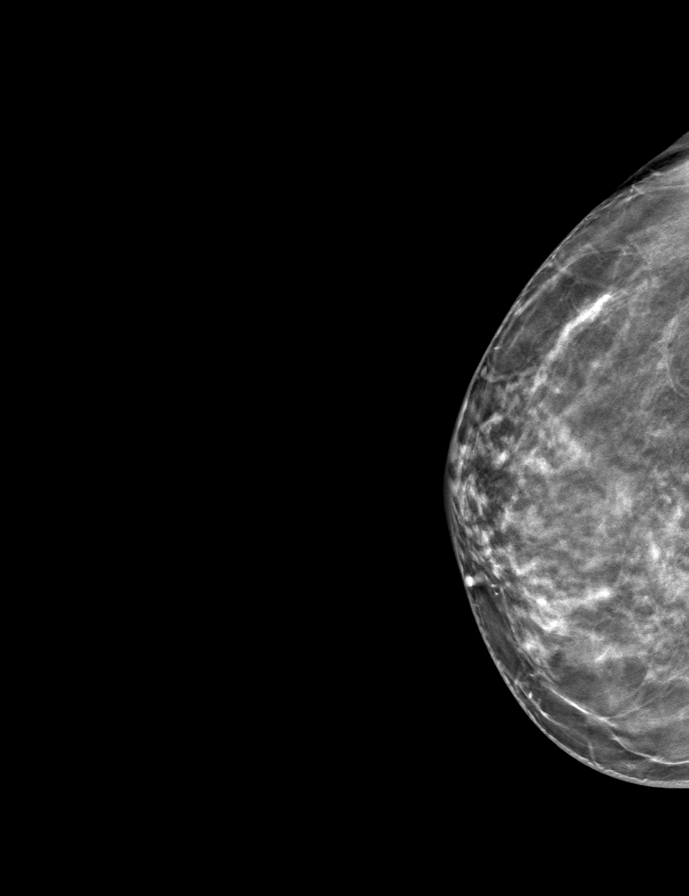

[L MLO tomo · tomo slice 31/62.0]
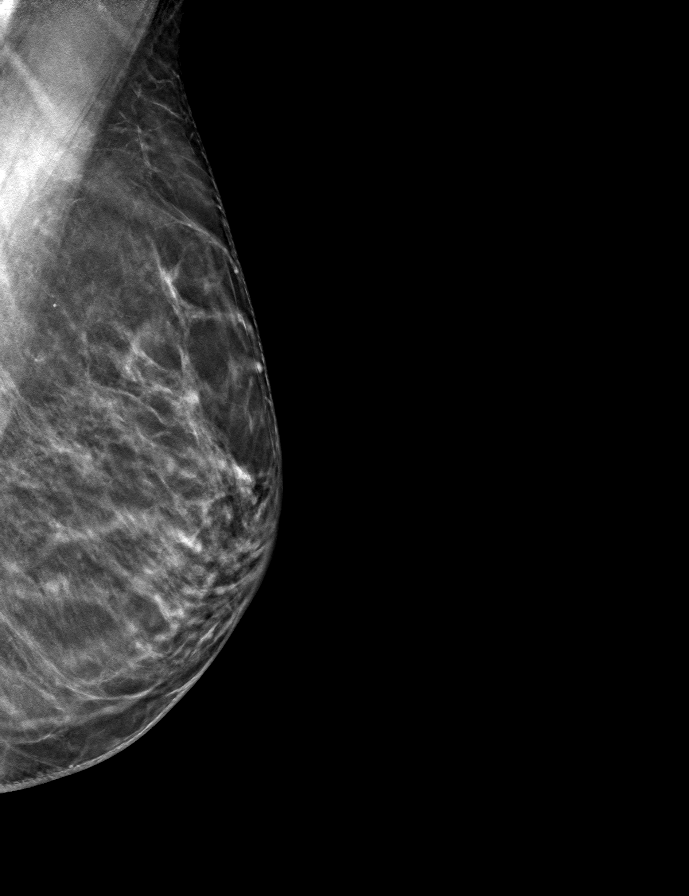

[L CC tomo · tomo slice 31/60.0]
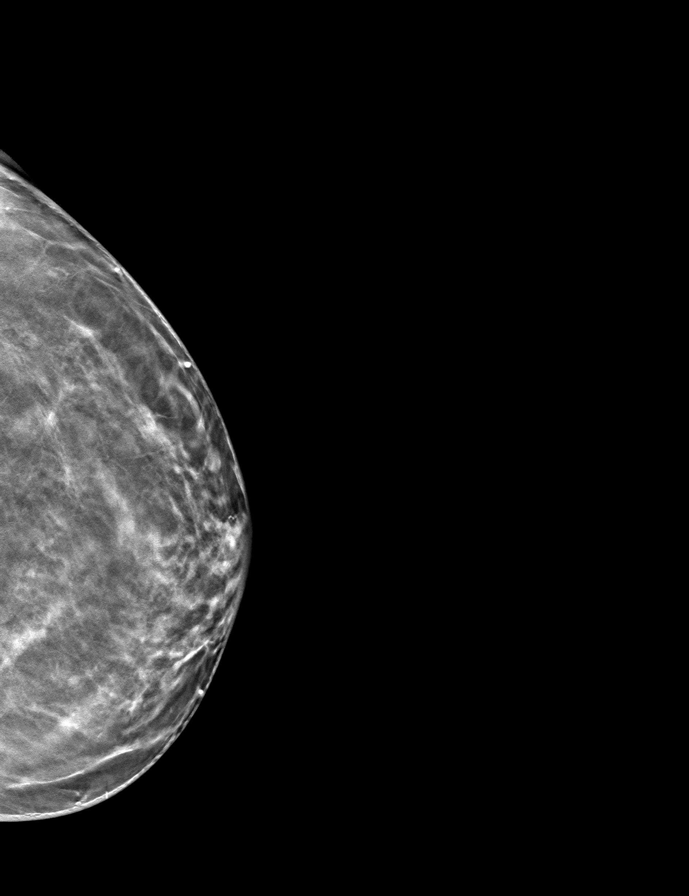

[9 of 24 positions shown; findings below may reference images not displayed]

ACR Breast Density Category b: There are scattered areas of
fibroglandular density.
FINDINGS: There are no findings suspicious for malignancy.
IMPRESSION: No mammographic evidence of malignancy. A result letter of this
screening mammogram will be mailed directly to the patient.

RECOMMENDATION:
Screening mammogram in one year. (Code:51-O-LD2)

BI-RADS CATEGORY  1: Negative.

## 2022-04-15 ENCOUNTER — Other Ambulatory Visit: Payer: Self-pay | Admitting: Student

## 2023-02-07 ENCOUNTER — Encounter: Payer: Self-pay | Admitting: *Deleted
